# Patient Record
Sex: Male | Born: 1975 | Race: Black or African American | Hispanic: No | Marital: Single | Smoking: Never smoker
Health system: Southern US, Community
[De-identification: ages and names within clinical notes are randomized; demographics above are authoritative.]

## PROBLEM LIST (undated history)

## (undated) DIAGNOSIS — R51 Headache: Secondary | ICD-10-CM

## (undated) DIAGNOSIS — A539 Syphilis, unspecified: Secondary | ICD-10-CM

## (undated) DIAGNOSIS — B20 Human immunodeficiency virus [HIV] disease: Secondary | ICD-10-CM

## (undated) DIAGNOSIS — R739 Hyperglycemia, unspecified: Secondary | ICD-10-CM

## (undated) DIAGNOSIS — Z21 Asymptomatic human immunodeficiency virus [HIV] infection status: Secondary | ICD-10-CM

## (undated) HISTORY — DX: Human immunodeficiency virus (HIV) disease: B20

## (undated) HISTORY — DX: Syphilis, unspecified: A53.9

## (undated) HISTORY — DX: Headache: R51

## (undated) HISTORY — DX: Asymptomatic human immunodeficiency virus (hiv) infection status: Z21

## (undated) HISTORY — DX: Hyperglycemia, unspecified: R73.9

---

## 2001-04-13 ENCOUNTER — Emergency Department (HOSPITAL_COMMUNITY): Admission: EM | Admit: 2001-04-13 | Discharge: 2001-04-14 | Payer: Self-pay

## 2001-04-14 ENCOUNTER — Encounter: Payer: Self-pay | Admitting: Emergency Medicine

## 2004-09-02 ENCOUNTER — Inpatient Hospital Stay (HOSPITAL_COMMUNITY): Admission: EM | Admit: 2004-09-02 | Discharge: 2004-09-12 | Payer: Self-pay | Admitting: Internal Medicine

## 2004-09-05 ENCOUNTER — Encounter (INDEPENDENT_AMBULATORY_CARE_PROVIDER_SITE_OTHER): Payer: Self-pay | Admitting: *Deleted

## 2004-09-05 ENCOUNTER — Encounter: Payer: Self-pay | Admitting: Infectious Disease

## 2004-09-05 ENCOUNTER — Ambulatory Visit: Payer: Self-pay | Admitting: Infectious Diseases

## 2004-09-05 LAB — CONVERTED CEMR LAB
CD4 Count: 10 microliters
CD4 T Cell Abs: 10

## 2005-12-03 ENCOUNTER — Encounter (INDEPENDENT_AMBULATORY_CARE_PROVIDER_SITE_OTHER): Payer: Self-pay | Admitting: *Deleted

## 2005-12-03 ENCOUNTER — Ambulatory Visit: Payer: Self-pay | Admitting: Infectious Diseases

## 2005-12-03 LAB — CONVERTED CEMR LAB
CD4 Count: 10 microliters
HIV 1 RNA Quant: 505000 copies/mL

## 2005-12-22 ENCOUNTER — Ambulatory Visit: Payer: Self-pay | Admitting: Infectious Diseases

## 2006-03-07 ENCOUNTER — Encounter: Admission: RE | Admit: 2006-03-07 | Discharge: 2006-03-07 | Payer: Self-pay | Admitting: Infectious Diseases

## 2006-03-07 ENCOUNTER — Ambulatory Visit: Payer: Self-pay | Admitting: Infectious Diseases

## 2006-03-07 ENCOUNTER — Encounter (INDEPENDENT_AMBULATORY_CARE_PROVIDER_SITE_OTHER): Payer: Self-pay | Admitting: *Deleted

## 2006-03-07 LAB — CONVERTED CEMR LAB
CD4 Count: 70 microliters
HIV 1 RNA Quant: 399 copies/mL

## 2006-04-11 ENCOUNTER — Ambulatory Visit: Payer: Self-pay | Admitting: Infectious Diseases

## 2006-10-18 ENCOUNTER — Encounter (INDEPENDENT_AMBULATORY_CARE_PROVIDER_SITE_OTHER): Payer: Self-pay | Admitting: *Deleted

## 2006-10-18 ENCOUNTER — Ambulatory Visit: Payer: Self-pay | Admitting: Internal Medicine

## 2006-10-18 ENCOUNTER — Encounter (INDEPENDENT_AMBULATORY_CARE_PROVIDER_SITE_OTHER): Payer: Self-pay | Admitting: Infectious Diseases

## 2006-10-18 ENCOUNTER — Encounter: Admission: RE | Admit: 2006-10-18 | Discharge: 2006-10-18 | Payer: Self-pay | Admitting: Internal Medicine

## 2006-10-18 LAB — CONVERTED CEMR LAB
CD4 Count: 270 uL
HIV 1 RNA Quant: 314 {copies}/mL

## 2007-01-02 ENCOUNTER — Encounter (INDEPENDENT_AMBULATORY_CARE_PROVIDER_SITE_OTHER): Payer: Self-pay | Admitting: *Deleted

## 2007-01-02 LAB — CONVERTED CEMR LAB

## 2007-01-15 ENCOUNTER — Encounter (INDEPENDENT_AMBULATORY_CARE_PROVIDER_SITE_OTHER): Payer: Self-pay | Admitting: *Deleted

## 2007-01-31 DIAGNOSIS — B20 Human immunodeficiency virus [HIV] disease: Secondary | ICD-10-CM

## 2007-02-06 ENCOUNTER — Encounter: Admission: RE | Admit: 2007-02-06 | Discharge: 2007-02-06 | Payer: Self-pay | Admitting: Infectious Diseases

## 2007-02-06 ENCOUNTER — Ambulatory Visit: Payer: Self-pay | Admitting: Infectious Diseases

## 2007-04-13 DIAGNOSIS — Z8709 Personal history of other diseases of the respiratory system: Secondary | ICD-10-CM | POA: Insufficient documentation

## 2007-04-18 ENCOUNTER — Ambulatory Visit: Payer: Self-pay | Admitting: Infectious Diseases

## 2007-06-15 ENCOUNTER — Telehealth: Payer: Self-pay | Admitting: Internal Medicine

## 2007-08-07 ENCOUNTER — Encounter: Admission: RE | Admit: 2007-08-07 | Discharge: 2007-08-07 | Payer: Self-pay | Admitting: Infectious Disease

## 2007-08-07 ENCOUNTER — Encounter: Payer: Self-pay | Admitting: Infectious Diseases

## 2007-08-07 ENCOUNTER — Ambulatory Visit: Payer: Self-pay | Admitting: Infectious Disease

## 2007-08-07 ENCOUNTER — Encounter (INDEPENDENT_AMBULATORY_CARE_PROVIDER_SITE_OTHER): Payer: Self-pay | Admitting: *Deleted

## 2007-08-07 LAB — CONVERTED CEMR LAB
ALT: 15 units/L (ref 0–53)
AST: 19 units/L (ref 0–37)
Albumin: 4.5 g/dL (ref 3.5–5.2)
Alkaline Phosphatase: 143 units/L — ABNORMAL HIGH (ref 39–117)
BUN: 10 mg/dL (ref 6–23)
Basophils Absolute: 0 10*3/uL (ref 0.0–0.1)
Basophils Relative: 0 % (ref 0–1)
Bilirubin Urine: NEGATIVE
CO2: 22 meq/L (ref 19–32)
Calcium: 9.1 mg/dL (ref 8.4–10.5)
Chloride: 107 meq/L (ref 96–112)
Creatinine, Ser: 1.1 mg/dL (ref 0.40–1.50)
Eosinophils Absolute: 0.1 10*3/uL (ref 0.0–0.7)
Eosinophils Relative: 2 % (ref 0–5)
Glucose, Bld: 98 mg/dL (ref 70–99)
HCT: 42 % (ref 39.0–52.0)
HIV 1 RNA Quant: 262 copies/mL — ABNORMAL HIGH (ref <50)
HIV-1 RNA Quant, Log: 2.42 — ABNORMAL HIGH (ref <1.70)
Hemoglobin, Urine: NEGATIVE
Hemoglobin: 14.5 g/dL (ref 13.0–17.0)
Ketones, ur: NEGATIVE mg/dL
Leukocytes, UA: NEGATIVE
Lymphocytes Relative: 46 % (ref 12–46)
Lymphs Abs: 2.7 10*3/uL (ref 0.7–3.3)
MCHC: 34.5 g/dL (ref 30.0–36.0)
MCV: 88.6 fL (ref 78.0–100.0)
Monocytes Absolute: 0.4 10*3/uL (ref 0.2–0.7)
Monocytes Relative: 7 % (ref 3–11)
Neutro Abs: 2.7 10*3/uL (ref 1.7–7.7)
Neutrophils Relative %: 45 % (ref 43–77)
Nitrite: NEGATIVE
Platelets: 262 10*3/uL (ref 150–400)
Potassium: 3.9 meq/L (ref 3.5–5.3)
Protein, ur: NEGATIVE mg/dL
RBC: 4.74 M/uL (ref 4.22–5.81)
RDW: 12.9 % (ref 11.5–14.0)
Sodium: 141 meq/L (ref 135–145)
Specific Gravity, Urine: 1.023 (ref 1.005–1.03)
Total Bilirubin: 0.2 mg/dL — ABNORMAL LOW (ref 0.3–1.2)
Total Protein: 7.4 g/dL (ref 6.0–8.3)
Urine Glucose: NEGATIVE mg/dL
Urobilinogen, UA: 0.2 (ref 0.0–1.0)
WBC: 5.9 10*3/uL (ref 4.0–10.5)
pH: 6 (ref 5.0–8.0)

## 2007-08-21 ENCOUNTER — Ambulatory Visit: Payer: Self-pay | Admitting: Infectious Disease

## 2007-08-21 DIAGNOSIS — R809 Proteinuria, unspecified: Secondary | ICD-10-CM | POA: Insufficient documentation

## 2007-08-21 LAB — CONVERTED CEMR LAB
Creatinine, Urine: 124.4 mg/dL
Microalb Creat Ratio: 1.6 mg/g (ref 0.0–30.0)
Microalb, Ur: 0.2 mg/dL (ref 0.00–1.89)

## 2007-08-30 ENCOUNTER — Encounter (INDEPENDENT_AMBULATORY_CARE_PROVIDER_SITE_OTHER): Payer: Self-pay | Admitting: *Deleted

## 2007-10-23 ENCOUNTER — Encounter: Admission: RE | Admit: 2007-10-23 | Discharge: 2007-10-23 | Payer: Self-pay | Admitting: Infectious Disease

## 2007-10-23 ENCOUNTER — Ambulatory Visit: Payer: Self-pay | Admitting: Infectious Disease

## 2007-10-23 LAB — CONVERTED CEMR LAB
ALT: 9 units/L (ref 0–53)
AST: 16 units/L (ref 0–37)
Albumin: 4.7 g/dL (ref 3.5–5.2)
Alkaline Phosphatase: 137 units/L — ABNORMAL HIGH (ref 39–117)
BUN: 13 mg/dL (ref 6–23)
Basophils Absolute: 0 10*3/uL (ref 0.0–0.1)
Basophils Relative: 0 % (ref 0–1)
CO2: 25 meq/L (ref 19–32)
Calcium: 9.2 mg/dL (ref 8.4–10.5)
Chloride: 105 meq/L (ref 96–112)
Creatinine, Ser: 0.94 mg/dL (ref 0.40–1.50)
Eosinophils Absolute: 0.1 10*3/uL — ABNORMAL LOW (ref 0.2–0.7)
Eosinophils Relative: 2 % (ref 0–5)
Glucose, Bld: 87 mg/dL (ref 70–99)
HCT: 41.8 % (ref 39.0–52.0)
HIV 1 RNA Quant: 103 copies/mL — ABNORMAL HIGH (ref <50)
HIV-1 RNA Quant, Log: 2.01 — ABNORMAL HIGH (ref <1.70)
Hemoglobin: 14.4 g/dL (ref 13.0–17.0)
Lymphocytes Relative: 40 % (ref 12–46)
Lymphs Abs: 2 10*3/uL (ref 0.7–4.0)
MCHC: 34.4 g/dL (ref 30.0–36.0)
MCV: 88.9 fL (ref 78.0–100.0)
Monocytes Absolute: 0.3 10*3/uL (ref 0.1–1.0)
Monocytes Relative: 6 % (ref 3–12)
Neutro Abs: 2.6 10*3/uL (ref 1.7–7.7)
Neutrophils Relative %: 52 % (ref 43–77)
Platelets: 234 10*3/uL (ref 150–400)
Potassium: 4.5 meq/L (ref 3.5–5.3)
RBC: 4.7 M/uL (ref 4.22–5.81)
RDW: 12.7 % (ref 11.5–15.5)
Sodium: 141 meq/L (ref 135–145)
Total Bilirubin: 0.3 mg/dL (ref 0.3–1.2)
Total Protein: 7.7 g/dL (ref 6.0–8.3)
WBC: 5 10*3/uL (ref 4.0–10.5)

## 2007-11-07 ENCOUNTER — Encounter (INDEPENDENT_AMBULATORY_CARE_PROVIDER_SITE_OTHER): Payer: Self-pay | Admitting: *Deleted

## 2007-12-27 ENCOUNTER — Ambulatory Visit: Payer: Self-pay | Admitting: Infectious Disease

## 2007-12-27 ENCOUNTER — Encounter: Admission: RE | Admit: 2007-12-27 | Discharge: 2007-12-27 | Payer: Self-pay | Admitting: Infectious Disease

## 2007-12-27 LAB — CONVERTED CEMR LAB
ALT: 11 units/L (ref 0–53)
AST: 15 units/L (ref 0–37)
Albumin: 4.6 g/dL (ref 3.5–5.2)
Alkaline Phosphatase: 127 units/L — ABNORMAL HIGH (ref 39–117)
BUN: 13 mg/dL (ref 6–23)
Basophils Absolute: 0 10*3/uL (ref 0.0–0.1)
Basophils Relative: 1 % (ref 0–1)
CO2: 24 meq/L (ref 19–32)
Calcium: 9.1 mg/dL (ref 8.4–10.5)
Chloride: 107 meq/L (ref 96–112)
Cholesterol: 110 mg/dL (ref 0–200)
Creatinine, Ser: 0.92 mg/dL (ref 0.40–1.50)
Eosinophils Absolute: 0 10*3/uL (ref 0.0–0.7)
Eosinophils Relative: 1 % (ref 0–5)
Glucose, Bld: 88 mg/dL (ref 70–99)
HCT: 39.6 % (ref 39.0–52.0)
HDL: 43 mg/dL (ref >39)
HIV 1 RNA Quant: 77 copies/mL — ABNORMAL HIGH (ref <50)
HIV-1 RNA Quant, Log: 1.89 — ABNORMAL HIGH (ref <1.70)
Hemoglobin: 13.4 g/dL (ref 13.0–17.0)
LDL Cholesterol: 60 mg/dL (ref 0–99)
Lymphocytes Relative: 40 % (ref 12–46)
Lymphs Abs: 2.2 10*3/uL (ref 0.7–4.0)
MCHC: 33.8 g/dL (ref 30.0–36.0)
MCV: 90.6 fL (ref 78.0–100.0)
Monocytes Absolute: 0.3 10*3/uL (ref 0.1–1.0)
Monocytes Relative: 6 % (ref 3–12)
Neutro Abs: 2.9 10*3/uL (ref 1.7–7.7)
Neutrophils Relative %: 53 % (ref 43–77)
Platelets: 246 10*3/uL (ref 150–400)
Potassium: 4.3 meq/L (ref 3.5–5.3)
RBC: 4.37 M/uL (ref 4.22–5.81)
RDW: 13 % (ref 11.5–15.5)
Sodium: 140 meq/L (ref 135–145)
Total Bilirubin: 0.4 mg/dL (ref 0.3–1.2)
Total CHOL/HDL Ratio: 2.6
Total Protein: 7.5 g/dL (ref 6.0–8.3)
Triglycerides: 35 mg/dL (ref <150)
VLDL: 7 mg/dL (ref 0–40)
WBC: 5.5 10*3/uL (ref 4.0–10.5)

## 2008-04-30 ENCOUNTER — Encounter: Admission: RE | Admit: 2008-04-30 | Discharge: 2008-04-30 | Payer: Self-pay | Admitting: Infectious Disease

## 2008-04-30 ENCOUNTER — Ambulatory Visit: Payer: Self-pay | Admitting: Infectious Disease

## 2008-04-30 ENCOUNTER — Encounter: Payer: Self-pay | Admitting: Infectious Disease

## 2008-04-30 ENCOUNTER — Other Ambulatory Visit: Admission: RE | Admit: 2008-04-30 | Discharge: 2008-04-30 | Payer: Self-pay | Admitting: Infectious Disease

## 2008-04-30 DIAGNOSIS — L919 Hypertrophic disorder of the skin, unspecified: Secondary | ICD-10-CM

## 2008-04-30 DIAGNOSIS — R748 Abnormal levels of other serum enzymes: Secondary | ICD-10-CM | POA: Insufficient documentation

## 2008-04-30 DIAGNOSIS — L909 Atrophic disorder of skin, unspecified: Secondary | ICD-10-CM | POA: Insufficient documentation

## 2008-04-30 DIAGNOSIS — A630 Anogenital (venereal) warts: Secondary | ICD-10-CM

## 2008-04-30 LAB — CONVERTED CEMR LAB
ALT: 14 units/L (ref 0–53)
AST: 17 units/L (ref 0–37)
Albumin: 4.9 g/dL (ref 3.5–5.2)
Alkaline Phosphatase: 122 units/L — ABNORMAL HIGH (ref 39–117)
BUN: 14 mg/dL (ref 6–23)
Basophils Absolute: 0 10*3/uL (ref 0.0–0.1)
Basophils Relative: 0 % (ref 0–1)
Bilirubin Urine: NEGATIVE
CO2: 26 meq/L (ref 19–32)
Calcium: 9.6 mg/dL (ref 8.4–10.5)
Chlamydia, Swab/Urine, PCR: NEGATIVE
Chloride: 106 meq/L (ref 96–112)
Creatinine, Ser: 0.91 mg/dL (ref 0.40–1.50)
Eosinophils Absolute: 0 10*3/uL (ref 0.0–0.7)
Eosinophils Relative: 1 % (ref 0–5)
GC Probe Amp, Urine: NEGATIVE
Glucose, Bld: 89 mg/dL (ref 70–99)
HCT: 41.6 % (ref 39.0–52.0)
HIV 1 RNA Quant: <50 copies/mL (ref <50)
HIV-1 RNA Quant, Log: <1.7 (ref <1.70)
Hemoglobin, Urine: NEGATIVE
Hemoglobin: 13.7 g/dL (ref 13.0–17.0)
Ketones, ur: NEGATIVE mg/dL
Leukocytes, UA: NEGATIVE
Lymphocytes Relative: 32 % (ref 12–46)
Lymphs Abs: 1.6 10*3/uL (ref 0.7–4.0)
MCHC: 32.9 g/dL (ref 30.0–36.0)
MCV: 90.8 fL (ref 78.0–100.0)
Monocytes Absolute: 0.4 10*3/uL (ref 0.1–1.0)
Monocytes Relative: 7 % (ref 3–12)
Neutro Abs: 3.1 10*3/uL (ref 1.7–7.7)
Neutrophils Relative %: 61 % (ref 43–77)
Nitrite: NEGATIVE
Platelets: 256 10*3/uL (ref 150–400)
Potassium: 4.1 meq/L (ref 3.5–5.3)
Protein, ur: NEGATIVE mg/dL
RBC: 4.58 M/uL (ref 4.22–5.81)
RDW: 13.3 % (ref 11.5–15.5)
Sodium: 142 meq/L (ref 135–145)
Specific Gravity, Urine: 1.021 (ref 1.005–1.03)
Total Bilirubin: 0.3 mg/dL (ref 0.3–1.2)
Total Protein: 7.4 g/dL (ref 6.0–8.3)
Urine Glucose: NEGATIVE mg/dL
Urobilinogen, UA: 0.2 (ref 0.0–1.0)
WBC: 5.2 10*3/uL (ref 4.0–10.5)
pH: 6.5 (ref 5.0–8.0)

## 2008-05-28 ENCOUNTER — Encounter: Payer: Self-pay | Admitting: Infectious Disease

## 2008-06-17 ENCOUNTER — Ambulatory Visit (HOSPITAL_BASED_OUTPATIENT_CLINIC_OR_DEPARTMENT_OTHER): Admission: RE | Admit: 2008-06-17 | Discharge: 2008-06-17 | Payer: Self-pay | Admitting: General Surgery

## 2008-06-17 ENCOUNTER — Encounter (INDEPENDENT_AMBULATORY_CARE_PROVIDER_SITE_OTHER): Payer: Self-pay | Admitting: General Surgery

## 2008-06-21 ENCOUNTER — Encounter: Payer: Self-pay | Admitting: Infectious Disease

## 2008-10-14 ENCOUNTER — Encounter (INDEPENDENT_AMBULATORY_CARE_PROVIDER_SITE_OTHER): Payer: Self-pay | Admitting: *Deleted

## 2009-02-18 ENCOUNTER — Telehealth: Payer: Self-pay | Admitting: Internal Medicine

## 2009-03-11 ENCOUNTER — Ambulatory Visit: Payer: Self-pay | Admitting: Infectious Disease

## 2009-03-11 LAB — CONVERTED CEMR LAB
ALT: 19 units/L (ref 0–53)
AST: 18 units/L (ref 0–37)
Albumin: 4.3 g/dL (ref 3.5–5.2)
Alkaline Phosphatase: 111 units/L (ref 39–117)
BUN: 11 mg/dL (ref 6–23)
Basophils Absolute: 0 10*3/uL (ref 0.0–0.1)
Basophils Relative: 0 % (ref 0–1)
CO2: 25 meq/L (ref 19–32)
Calcium: 8.8 mg/dL (ref 8.4–10.5)
Chloride: 108 meq/L (ref 96–112)
Cholesterol: 105 mg/dL (ref 0–200)
Creatinine, Ser: 1.04 mg/dL (ref 0.40–1.50)
Eosinophils Absolute: 0 10*3/uL (ref 0.0–0.7)
Eosinophils Relative: 0 % (ref 0–5)
GFR calc Af Amer: >60 mL/min (ref >60)
GFR calc non Af Amer: >60 mL/min (ref >60)
Glucose, Bld: 89 mg/dL (ref 70–99)
HCT: 38.3 % — ABNORMAL LOW (ref 39.0–52.0)
HDL: 42 mg/dL (ref >39)
HIV 1 RNA Quant: 199 copies/mL — ABNORMAL HIGH (ref <48)
HIV-1 RNA Quant, Log: 2.3 — ABNORMAL HIGH (ref <1.68)
Hemoglobin: 13.4 g/dL (ref 13.0–17.0)
LDL Cholesterol: 57 mg/dL (ref 0–99)
Lymphocytes Relative: 38 % (ref 12–46)
Lymphs Abs: 1.7 10*3/uL (ref 0.7–4.0)
MCHC: 35 g/dL (ref 30.0–36.0)
MCV: 84.5 fL (ref 78.0–100.0)
Monocytes Absolute: 0.4 10*3/uL (ref 0.1–1.0)
Monocytes Relative: 8 % (ref 3–12)
Neutro Abs: 2.5 10*3/uL (ref 1.7–7.7)
Neutrophils Relative %: 54 % (ref 43–77)
Platelets: 253 10*3/uL (ref 150–400)
Potassium: 4 meq/L (ref 3.5–5.3)
RBC: 4.53 M/uL (ref 4.22–5.81)
RDW: 13.2 % (ref 11.5–15.5)
Sodium: 142 meq/L (ref 135–145)
Total Bilirubin: 0.3 mg/dL (ref 0.3–1.2)
Total CHOL/HDL Ratio: 2.5
Total Protein: 6.9 g/dL (ref 6.0–8.3)
Triglycerides: 32 mg/dL (ref <150)
VLDL: 6 mg/dL (ref 0–40)
WBC: 4.6 10*3/uL (ref 4.0–10.5)

## 2009-03-24 ENCOUNTER — Ambulatory Visit: Payer: Self-pay | Admitting: Infectious Disease

## 2009-11-10 ENCOUNTER — Ambulatory Visit: Payer: Self-pay | Admitting: Infectious Disease

## 2009-11-10 LAB — CONVERTED CEMR LAB
ALT: 11 units/L (ref 0–53)
AST: 14 units/L (ref 0–37)
Albumin: 4.2 g/dL (ref 3.5–5.2)
Alkaline Phosphatase: 108 units/L (ref 39–117)
BUN: 13 mg/dL (ref 6–23)
Basophils Absolute: 0 10*3/uL (ref 0.0–0.1)
Basophils Relative: 1 % (ref 0–1)
CO2: 22 meq/L (ref 19–32)
Calcium: 9 mg/dL (ref 8.4–10.5)
Chloride: 108 meq/L (ref 96–112)
Creatinine, Ser: 0.9 mg/dL (ref 0.40–1.50)
Eosinophils Absolute: 0 10*3/uL (ref 0.0–0.7)
Eosinophils Relative: 1 % (ref 0–5)
Glucose, Bld: 97 mg/dL (ref 70–99)
HCT: 38.4 % — ABNORMAL LOW (ref 39.0–52.0)
HIV 1 RNA Quant: <48 copies/mL (ref <48)
HIV-1 RNA Quant, Log: <1.68 (ref <1.68)
Hemoglobin: 13 g/dL (ref 13.0–17.0)
Lymphocytes Relative: 30 % (ref 12–46)
Lymphs Abs: 1.3 10*3/uL (ref 0.7–4.0)
MCHC: 33.9 g/dL (ref 30.0–36.0)
MCV: 86.7 fL (ref >=78.0)
Monocytes Absolute: 0.3 10*3/uL (ref 0.1–1.0)
Monocytes Relative: 6 % (ref 3–12)
Neutro Abs: 2.8 10*3/uL (ref 1.7–7.7)
Neutrophils Relative %: 63 % (ref 43–77)
Platelets: 258 10*3/uL (ref 150–400)
Potassium: 4.2 meq/L (ref 3.5–5.3)
RBC: 4.43 M/uL (ref 4.22–5.81)
RDW: 12.8 % (ref 11.5–15.5)
Sodium: 143 meq/L (ref 135–145)
Total Bilirubin: 0.3 mg/dL (ref 0.3–1.2)
Total Protein: 7.2 g/dL (ref 6.0–8.3)
WBC: 4.4 10*3/uL (ref 4.0–10.5)

## 2009-12-01 ENCOUNTER — Ambulatory Visit: Payer: Self-pay | Admitting: Infectious Disease

## 2010-04-14 ENCOUNTER — Telehealth (INDEPENDENT_AMBULATORY_CARE_PROVIDER_SITE_OTHER): Payer: Self-pay | Admitting: *Deleted

## 2010-07-31 ENCOUNTER — Telehealth (INDEPENDENT_AMBULATORY_CARE_PROVIDER_SITE_OTHER): Payer: Self-pay | Admitting: *Deleted

## 2010-08-25 ENCOUNTER — Telehealth (INDEPENDENT_AMBULATORY_CARE_PROVIDER_SITE_OTHER): Payer: Self-pay | Admitting: *Deleted

## 2010-10-06 ENCOUNTER — Encounter (INDEPENDENT_AMBULATORY_CARE_PROVIDER_SITE_OTHER): Payer: Self-pay | Admitting: *Deleted

## 2010-10-19 ENCOUNTER — Encounter (INDEPENDENT_AMBULATORY_CARE_PROVIDER_SITE_OTHER): Payer: Self-pay | Admitting: *Deleted

## 2010-10-20 ENCOUNTER — Telehealth (INDEPENDENT_AMBULATORY_CARE_PROVIDER_SITE_OTHER): Payer: Self-pay | Admitting: *Deleted

## 2010-11-23 ENCOUNTER — Encounter: Payer: Self-pay | Admitting: Infectious Disease

## 2010-11-23 ENCOUNTER — Ambulatory Visit
Admission: RE | Admit: 2010-11-23 | Discharge: 2010-11-23 | Payer: Self-pay | Source: Home / Self Care | Attending: Infectious Disease | Admitting: Infectious Disease

## 2010-11-23 LAB — CONVERTED CEMR LAB
ALT: 39 units/L (ref 0–53)
AST: 77 units/L — ABNORMAL HIGH (ref 0–37)
Albumin: 4.5 g/dL (ref 3.5–5.2)
Alkaline Phosphatase: 102 units/L (ref 39–117)
BUN: 12 mg/dL (ref 6–23)
Basophils Absolute: 0 10*3/uL (ref 0.0–0.1)
Basophils Relative: 1 % (ref 0–1)
CO2: 29 meq/L (ref 19–32)
Calcium: 9.4 mg/dL (ref 8.4–10.5)
Chloride: 107 meq/L (ref 96–112)
Cholesterol: 118 mg/dL (ref 0–200)
Creatinine, Ser: 0.9 mg/dL (ref 0.40–1.50)
Eosinophils Absolute: 0.1 10*3/uL (ref 0.0–0.7)
Eosinophils Relative: 1 % (ref 0–5)
Glucose, Bld: 97 mg/dL (ref 70–99)
HCT: 40.9 % (ref 39.0–52.0)
HDL: 51 mg/dL (ref >39)
HIV 1 RNA Quant: <20 copies/mL (ref <20)
HIV-1 RNA Quant, Log: <1.3 (ref <1.30)
Hemoglobin: 13.9 g/dL (ref 13.0–17.0)
LDL Cholesterol: 60 mg/dL (ref 0–99)
Lymphocytes Relative: 35 % (ref 12–46)
Lymphs Abs: 1.8 10*3/uL (ref 0.7–4.0)
MCHC: 34 g/dL (ref 30.0–36.0)
MCV: 89.5 fL (ref 78.0–100.0)
Monocytes Absolute: 0.4 10*3/uL (ref 0.1–1.0)
Monocytes Relative: 8 % (ref 3–12)
Neutro Abs: 2.9 10*3/uL (ref 1.7–7.7)
Neutrophils Relative %: 55 % (ref 43–77)
Platelets: 261 10*3/uL (ref 150–400)
Potassium: 4.6 meq/L (ref 3.5–5.3)
RBC: 4.57 M/uL (ref 4.22–5.81)
RDW: 12.8 % (ref 11.5–15.5)
RPR Ser Ql: REACTIVE — AB
RPR Titer: 1:16 {titer} — AB
Sodium: 142 meq/L (ref 135–145)
T pallidum Antibodies (TP-PA): >8 — ABNORMAL HIGH (ref <0.90)
Total Bilirubin: 0.3 mg/dL (ref 0.3–1.2)
Total CHOL/HDL Ratio: 2.3
Total Protein: 7.3 g/dL (ref 6.0–8.3)
Triglycerides: 37 mg/dL (ref <150)
VLDL: 7 mg/dL (ref 0–40)
WBC: 5.2 10*3/uL (ref 4.0–10.5)

## 2010-11-25 LAB — T-HELPER CELL (CD4) - (RCID CLINIC ONLY)
CD4 % Helper T Cell: 21 % — ABNORMAL LOW (ref 33–55)
CD4 T Cell Abs: 430 uL (ref 400–2700)

## 2010-12-06 LAB — CONVERTED CEMR LAB
ALT: 10 units/L (ref 0–53)
AST: 16 units/L (ref 0–37)
Albumin: 4.6 g/dL (ref 3.5–5.2)
Alkaline Phosphatase: 165 units/L — ABNORMAL HIGH (ref 39–117)
BUN: 14 mg/dL (ref 6–23)
Basophils Absolute: 0 10*3/uL (ref 0.0–0.1)
Basophils Relative: 0 % (ref 0–1)
Bilirubin Urine: NEGATIVE
CD4 Count: 160 microliters
CO2: 24 meq/L (ref 19–32)
Calcium: 9.2 mg/dL (ref 8.4–10.5)
Chloride: 103 meq/L (ref 96–112)
Cholesterol: 123 mg/dL (ref 0–200)
Creatinine, Ser: 1.05 mg/dL (ref 0.40–1.50)
Eosinophils Absolute: 0 10*3/uL (ref 0.0–0.7)
Eosinophils Relative: 1 % (ref 0–5)
Glucose, Bld: 94 mg/dL (ref 70–99)
HCT: 41.5 % (ref 39.0–52.0)
HDL: 42 mg/dL (ref >39)
HIV 1 RNA Quant: 332 copies/mL — ABNORMAL HIGH (ref <50)
HIV-1 RNA Quant, Log: 2.52 — ABNORMAL HIGH (ref <1.70)
Hemoglobin: 14.4 g/dL (ref 13.0–17.0)
Ketones, ur: NEGATIVE mg/dL
LDL Cholesterol: 68 mg/dL (ref 0–99)
Leukocytes, UA: NEGATIVE
Lymphocytes Relative: 23 % (ref 12–46)
Lymphs Abs: 0.8 10*3/uL (ref 0.7–3.3)
MCHC: 34.7 g/dL (ref 30.0–36.0)
MCV: 88.7 fL (ref 78.0–100.0)
Monocytes Absolute: 0.3 10*3/uL (ref 0.2–0.7)
Monocytes Relative: 9 % (ref 3–11)
Neutro Abs: 2.3 10*3/uL (ref 1.7–7.7)
Neutrophils Relative %: 67 % (ref 43–77)
Nitrite: NEGATIVE
Platelets: 235 10*3/uL (ref 150–400)
Potassium: 4 meq/L (ref 3.5–5.3)
Protein, ur: 30 mg/dL — AB
RBC: 4.68 M/uL (ref 4.22–5.81)
RDW: 12.8 % (ref 11.5–14.0)
Sodium: 136 meq/L (ref 135–145)
Specific Gravity, Urine: 1.029 (ref 1.005–1.03)
Total Bilirubin: 0.3 mg/dL (ref 0.3–1.2)
Total CHOL/HDL Ratio: 2.9
Total Protein: 7.7 g/dL (ref 6.0–8.3)
Triglycerides: 63 mg/dL (ref <150)
Urine Glucose: NEGATIVE mg/dL
Urobilinogen, UA: 0.2 (ref 0.0–1.0)
VLDL: 13 mg/dL (ref 0–40)
WBC: 3.5 10*3/uL — ABNORMAL LOW (ref 4.0–10.5)
pH: 6.5 (ref 5.0–8.0)

## 2010-12-07 ENCOUNTER — Encounter: Payer: Self-pay | Admitting: Infectious Disease

## 2010-12-07 ENCOUNTER — Ambulatory Visit
Admission: RE | Admit: 2010-12-07 | Discharge: 2010-12-07 | Payer: Self-pay | Source: Home / Self Care | Attending: Infectious Disease | Admitting: Infectious Disease

## 2010-12-07 DIAGNOSIS — A529 Late syphilis, unspecified: Secondary | ICD-10-CM | POA: Insufficient documentation

## 2010-12-07 LAB — CONVERTED CEMR LAB
Chlamydia, Swab/Urine, PCR: NEGATIVE
GC Probe Amp, Urine: NEGATIVE

## 2010-12-08 NOTE — Assessment & Plan Note (Signed)
Summary: fukam   Visit Type:  Follow-up Primary Provider:  Paulette Blanch Dam MD  CC:  F/U LABS.  History of Present Illness: 35 year old African American male with HIV VL undetectable and CD4 at 270, presents to clinic for followup. He is sexually active with his HIV positive partner (also on [place]) and claims that they use condoms with all forms of sexual intercourse. He has no other complaints today.  Preventive Screening-Counseling & Management  Alcohol-Tobacco     Alcohol drinks/day: 0     Smoking Status: never     Passive Smoke Exposure: no  Caffeine-Diet-Exercise     Caffeine use/day: 1 cup of coffee/1 glass of tea per day, occassional soda     Does Patient Exercise: yes     Type of exercise: video workouts     Exercise (avg: min/session): 30-60     Times/week: 4      Drug Use:  no.     Current Allergies (reviewed today): ! 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 Never Smoked Alcohol use-no Drug use-no  Risk Factors: Alcohol Use: 0 (12/01/2009) Caffeine Use: 1 cup of coffee/1 glass of tea per day, occassional soda (12/01/2009) Exercise: yes (12/01/2009)  Risk Factors: Smoking Status: never (12/01/2009) Passive Smoke Exposure: no (12/01/2009)  Past Surgical History: none  Review of Systems  The patient denies anorexia, fever, weight loss, weight gain, vision loss, decreased hearing, hoarseness, chest pain, syncope, dyspnea on exertion, peripheral edema, prolonged cough, headaches, hemoptysis, abdominal pain, melena, hematochezia, severe indigestion/heartburn, hematuria, incontinence, muscle weakness, suspicious skin lesions, transient blindness, difficulty walking, depression, unusual weight change,  abnormal bleeding, and enlarged lymph nodes.    Vital Signs:  Patient profile:   35 year old male Height:      67 inches (170.18 cm) Weight:      155 pounds (70.45 kg) BMI:     24.36 Pulse rate:   77 / minute BP sitting:   110 / 68  (left arm)  Vitals Entered By: Starleen Arms CMA (December 01, 2009 10:42 AM) CC: F/U LABS Is Patient Diabetic? No Pain Assessment Patient in pain? no      Nutritional Status BMI of 19 -24 = normal Nutritional Status Detail NL  Does patient need assistance? Functional Status Self care Ambulation Normal   Physical Exam  General:  alert, well-developed, and well-nourished.   Head:  normocephalic and atraumatic.   Eyes:  vision grossly intact, pupils equal and pupils round.   Ears:  no external deformities.   Nose:  no external deformity.   Mouth:  pharynx pink and moist, no erythema, and no exudates.   Neck:  supple and full ROM.   Lungs:  normal respiratory effort, normal respiratory effort, normal breath sounds, no crackles, and no wheezes.   Heart:  normal rate, regular rhythm, no murmur, no gallop, and no rub.   Abdomen:  no distention.  soft, normal bowel sounds, and no distention.   Msk:  normal ROM.  no joint deformities.   Extremities:  No clubbing, cyanosis, edema, or deformity noted with normal full range of motion of all joints.   Neurologic:  alert & oriented X3.  strength normal in all extremities and gait normal.   Skin:  no rashes and no ecchymoses.   Psych:  Oriented X3, memory intact for recent and remote, normally interactive, and good eye contact.          Medication Adherence: 12/01/2009   Adherence to medications reviewed with patient. Counseling to provide adequate adherence provided   Prevention For Positives: 12/01/2009   Safe sex practices discussed with patient. Condoms offered.   Education Materials Provided: 12/01/2009 Safe sex practices discussed with patient. Condoms offered.                           Impression & Recommendations:  Problem # 1:  HIV DISEASE (ICD-042) Assessment Comment Only  Excellent control! Diagnostics Reviewed:  HIV: CDC-defined AIDS (06/21/2008)   CD4: 270 (11/11/2009)   WBC: 4.4 (11/10/2009)   Hgb: 13.0 (11/10/2009)   HCT: 38.4 (11/10/2009)   Platelets: 258 (11/10/2009) HIV-1 RNA: <48 copies/mL (11/10/2009)   HBSAg: NO (01/02/2007)  Orders: Est. 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  Orders: Est. Patient Level IV (56213)  Problem # 3:  PROTEINURIA (ICD-791.0)  recheck UA ant next visit  Orders: Est. Patient Level IV (08657)  Problem # 4:  ENCOUNTER FOR LONG-TERM USE OF OTHER MEDICATIONS (ICD-V58.69) check lipids next visit  Other Orders: Admin 1st Vaccine (84696) Flu Vaccine 43yrs + (29528) Pneumococcal Vaccine (41324) Admin of Any Addtl Vaccine (40102) Future Orders: T-CD4SP ([facility]) (CD4SP) ... 11/26/2010 T-HIV Viral Load [id number]) ... 11/26/2010 T-CBC w/Diff ([id number]) ... 11/26/2010 T-RPR (Syphilis) [id number]) ... 11/26/2010 T-Comprehensive Metabolic Panel [id number]) ... 11/26/2010 T-Lipid Profile [id number]) ... 11/26/2010  Patient Instructions: 1)  Please schedule a follow-up appointment in 1 year. 2)  Be sure to return for lab work one (2) week before your next appointment as scheduled.  Process Orders Check Orders Results:     [facility]: ABN not required for this insurance Tests Sent for requisitioning (December 02, 2009 12:45 AM):     11/26/2010: [facility] -- T-HIV Viral Load [phone] (signed)     11/26/2010: [facility] -- T-CBC w/Diff [[id number]] (signed)     11/26/2010: [facility] -- T-RPR (Syphilis) [id number] (signed)     11/26/2010: [facility] -- T-Comprehensive Metabolic Panel [[id number]] (signed)     11/26/2010: [facility] -- T-Lipid Profile [phone] (signed)   Prevention & Chronic Care Immunizations   Influenza vaccine: Fluvax 3+  (12/01/2009)    Tetanus booster: Not documented    Pneumococcal vaccine: Pneumovax  (12/01/2009)  Other Screening   Smoking status: never  (12/01/2009)  Flu Vaccine Consent Questions     Do you have a history of severe allergic reactions to this vaccine? no    Any prior history of allergic reactions to egg and/or gelatin? no    Do you have a sensitivity to the preservative Thimersol? no    Do you have a past history of Guillan-Barre Syndrome? no    Do you currently have an acute febrile illness? no    Have you ever had a severe reaction to latex? no    Vaccine information given and explained to patient? yes    Are you currently pregnant? no    Lot [id number]   Exp Date:02/05/2010   Manufacturer: [employer]    Site Given  Left Deltoid IM Starleen Arms CMA  December 01, 2009 11:24 AM   Immunizations Administered:  Pneumonia Vaccine:    Vaccine Type: Pneumovax    Site: left deltoid  Mfr: Merck    Dose: 0.5 ml    Route: IM    Given by: Starleen Arms CMA    Exp. Date: 02/26/2011    Lot #: [id number]    VIS given: 06/05/96 version given December 01, 2009.  .mchsflu

## 2010-12-08 NOTE — Progress Notes (Signed)
Summary: refill/mld  Phone Note Refill Request Message from:  Fax from Pharmacy on April 14, 2010 9:01 AM  Refills Requested: Medication #1:  ATRIPLA 600-200-300 MG TABS Take 1 tablet by mouth at bedtime.   Last Refilled: 03/12/2010 Received fax refill request from [facility].   Method Requested: Fax to Local Pharmacy Next Appointment Scheduled: Jan 2012 Initial call taken by: Paulo Fruit  BS,CPht II,MPH,  April 14, 2010 9:01 AM    Prescriptions: ATRIPLA 600-200-300 MG TABS (EFAVIRENZ-EMTRICITAB-TENOFOVIR) Take 1 tablet by mouth at bedtime  #30 x 12   Entered by:   Paulo Fruit  BS,CPht II,MPH   Authorized by:   Acey Lav MD   Signed by:   Paulo Fruit  BS,CPht II,MPH on 04/14/2010   Method used:   [facility] to        [facility] [phone]* (retail)       [address]       [place], [place]  [address]       Ph: [phone] or [phone]       Fax: [phone]   RxID:   [id number]  Paulo Fruit  BS,CPht II,MPH  April 14, 2010 9:01 AM

## 2010-12-08 NOTE — Miscellaneous (Signed)
Summary: Orders Update - labs  Clinical Lists Changes  Orders: Added new Test order of T-CBC w/Diff [id number]) - Signed Added new Test order of T-CD4SP [facility]) (CD4SP) - Signed Added new Test order of T-Comprehensive Metabolic Panel [phone]) - Signed Added new Test order of T-HIV Viral Load [phone]) - Signed

## 2010-12-08 NOTE — Progress Notes (Signed)
Summary: Medication sample  Phone Note Call from Patient   Caller: Patient Summary of Call: patient needed medicine. Per Dr. Daiva Eves, gave patient a one month sample.

## 2010-12-08 NOTE — Miscellaneous (Signed)
  Clinical Lists Changes  Appended Document: lab added-ID

## 2010-12-08 NOTE — Progress Notes (Signed)
Summary: Pt called for appt w/Samuel Lindsey  Phone Note Call from Patient   Caller: Patient Details for Reason: RW card renewal Summary of Call: patient lives 4 hours away and is not due to come for office visit til 11/2010.  He has just started a new job but insurance has not come into affect yet.  He is needing assistance with getting medication.  An appt has been scheduled with Byrd Hesselbach for Tuesday, 08/04/10 to discuss all of this and get patient help Initial call taken by: Paulo Fruit  BS,CPht II,MPH,  July 31, 2010 3:17 PM

## 2010-12-10 NOTE — Miscellaneous (Signed)
Summary: RW Financial Update  Clinical Lists Changes  Observations: Added new observation of YEARLYEXPEN: 2200  (10/19/2010 14:21) Added new observation of RWTITLE: B  (10/19/2010 14:21) Added new observation of PCTFPL: 261.73  (10/19/2010 14:21) Added new observation of HOUSEINCOME: 63875  (10/19/2010 14:21) Added new observation of FINASSESSDT: 10/16/2010  (10/19/2010 14:21)

## 2010-12-10 NOTE — Progress Notes (Signed)
Summary: Atripla Co-pay card mailed to pt.   Phone Note Outgoing Call   Call placed by: Jennet Maduro RN,  October 20, 2010 12:03 PM Call placed to: Patient Action Taken: Information Sent Summary of Call: RN will mail an Atripla Co-pay card to pt.  Message left for pt. Jennet Maduro RN  October 20, 2010 12:04 PM

## 2010-12-16 NOTE — Assessment & Plan Note (Signed)
Summary: 2WK F/U ON LABS/VS   Visit Type:  Follow-up Primary Provider:  Paulette Blanch Dam MD  CC:  f/u.  History of Present Illness: 35 year old African American male with HIV VL undetectable and healthy cd4 count. His RPR was positive 1:16.  He had no primary chanchre or evidence of 2ndary syphilis. He admits to having unprotected intercourse with another HIV positive man with both receptive and insertive anal intercourse without condoms on one occasion. I warned him about danger of spreading his virus and also of secondary infection with HIV. He had diffuse rash with hives with amoxicillin but despite this wished for IM PCN. I told him I would feel better giving him oral doxycyline first . He is going back to RN school.  Problems Prior to Update: 1)  Need Proph Vacc&inoculat Against Oth Comb Dz  (ICD-V06.8) 2)  Encounter For Long-term Use of Other Medications  (ICD-V58.69) 3)  Skin Tag  (ICD-701.9) 4)  Alkaline Phosphatase, Elevated  (ICD-790.5) 5)  Condyloma Acuminatum  (ICD-078.11) 6)  Preventive Health Care  (ICD-V70.0) 7)  Proteinuria  (ICD-791.0) 8)  Pneumonia, Hx of  (ICD-V12.60) 9)  HIV Disease  (ICD-042)  Medications Prior to Update: 1)  Atripla 600-200-300 Mg Tabs (Efavirenz-Emtricitab-Tenofovir) .... Take 1 Tablet By Mouth At Bedtime  Current Medications (verified): 1)  Atripla 600-200-300 Mg Tabs (Efavirenz-Emtricitab-Tenofovir) .... Take 1 Tablet By Mouth At Bedtime 2)  Doxycycline Hyclate 100 Mg Tabs (Doxycycline Hyclate) .... Take 1 Tablet By Mouth Two Times A Day For 28 Days  Allergies (verified): 1)  ! Amoxicillin     Current Allergies (reviewed today): ! 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 Never Smoked Alcohol use-no Drug use-no  Risk Factors: Alcohol Use: 0  (12/01/2009) Caffeine Use: 1 cup of coffee/1 glass of tea per day, occassional soda (12/01/2009) Exercise: yes (12/01/2009)  Risk Factors: Smoking Status: never (12/01/2009) Passive Smoke Exposure: no (12/01/2009)  Past Medical History: HIV disease Pneumonia, hx of pneumocystis carinii  Condyloma accuminata Skin tag Late Syphliis  Review of Systems       see HPI otherwise negative on 12 pt review  Vital Signs:  Patient profile:   35 year old male Height:      67 inches (170.18 cm) Weight:      169.75 pounds (77.16 kg) BMI:     26.68 Temp:     97.7 degrees F (36.50 degrees C) oral Pulse rate:   94 / minute BP sitting:   129 / 80  (left arm)  Vitals Entered By: Starleen Arms CMA (December 07, 2010 10:16 AM) CC: f/u Is Patient Diabetic? No Pain Assessment Patient in pain? no      Nutritional Status BMI of 25 - 29 = overweight  Does patient need assistance? Functional Status Self care Ambulation Normal        Medication Adherence: 12/07/2010   Adherence to medications reviewed with patient. Counseling to provide adequate adherence provided   Prevention For Positives: 12/07/2010   Safe sex practices discussed with patient. Condoms offered.   Education Materials Provided: 12/07/2010 Safe sex practices discussed with patient. Condoms offered.                          Physical Exam  General:  alert, well-developed, and well-nourished.   Head:  normocephalic and atraumatic.  Eyes:  vision grossly intact, pupils equal and pupils round.   Ears:  no external deformities.   Nose:  no external deformity.   Mouth:  pharynx pink and moist, no erythema, and no exudates.   Neck:  supple and full ROM.   Lungs:  normal respiratory effort, normal respiratory effort, normal breath sounds, no crackles, and no wheezes.   Heart:  normal rate, regular rhythm, no murmur, no gallop, and no rub.   Abdomen:  no distention.  soft, normal bowel sounds, and no distention.     Msk:  normal ROM.  no joint deformities.   Extremities:  No clubbing, cyanosis, edema, or deformity noted with normal full range of motion of all joints.   Neurologic:  alert & oriented X3.  strength normal in all extremities and gait normal.   Skin:  no rashes and no ecchymoses.   Psych:  Oriented X3, memory intact for recent and remote, normally interactive, and good eye contact.     Impression & Recommendations:  Problem # 1:  HIV DISEASE (ICD-042)  Excellent control but he needs to use protection!! His updated medication list for this problem includes:    Doxycycline Hyclate 100 Mg Tabs (Doxycycline hyclate) .Marland Kitchen... Take 1 tablet by mouth two times a day for 28 days  Orders: Est. 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 Patient Level IV (98119)  Problem # 3:  SEXUAL ACTIVITY, HIGH RISK (ICD-V69.2)  needs to use condoms!!  Orders: Est. Patient Level IV (14782)  Medications Added to Medication List This Visit: 1)  Doxycycline Hyclate 100 Mg Tabs (Doxycycline hyclate) .... Take 1 tablet by mouth two times a day for 28 days  Other Orders: T-GC Probe, urine [id number]) T-Chlamydia  Probe, urine [id number]) Future Orders: T-CD4SP ([facility]) (CD4SP) ... 03/07/2011 T-HIV Viral Load [id number]) ... 03/07/2011 T-RPR (Syphilis) [phone]) ... 03/07/2011 T-CBC w/Diff ([id number]) ... 03/07/2011 T-Comprehensive Metabolic Panel [phone]) ... 03/07/2011  Patient Instructions: 1)  rtc in 3 months time for recheck of labs    Immunization History:  Influenza Immunization History:    Influenza:  historical (07/13/2010) Prescriptions: DOXYCYCLINE HYCLATE 100 MG TABS (DOXYCYCLINE HYCLATE) Take 1 tablet by mouth two times a day for 28 days  #56 x 0   Entered and Authorized by:   Acey Lav MD   Signed by:   Paulette Blanch Dam MD on  12/07/2010   Method used:   [facility] to        [facility] [address] [phone]* (retail)       [address]       [place], [place]  [address]       Ph: [phone] or [phone]       Fax: [phone]   RxID:   [id number]

## 2011-01-24 LAB — T-HELPER CELL (CD4) - (RCID CLINIC ONLY)
CD4 % Helper T Cell: 24 % — ABNORMAL LOW (ref 33–55)
CD4 T Cell Abs: 270 uL — ABNORMAL LOW (ref 400–2700)

## 2011-02-16 LAB — T-HELPER CELL (CD4) - (RCID CLINIC ONLY)
CD4 % Helper T Cell: 20 % — ABNORMAL LOW (ref 33–55)
CD4 T Cell Abs: 340 uL — ABNORMAL LOW (ref 400–2700)

## 2011-03-08 ENCOUNTER — Other Ambulatory Visit: Payer: Self-pay

## 2011-03-09 ENCOUNTER — Other Ambulatory Visit: Payer: 59

## 2011-03-09 DIAGNOSIS — B20 Human immunodeficiency virus [HIV] disease: Secondary | ICD-10-CM

## 2011-03-09 DIAGNOSIS — Z113 Encounter for screening for infections with a predominantly sexual mode of transmission: Secondary | ICD-10-CM

## 2011-03-09 LAB — COMPREHENSIVE METABOLIC PANEL
ALT: 42 U/L (ref 0–53)
AST: 51 U/L — ABNORMAL HIGH (ref 0–37)
Albumin: 4.4 g/dL (ref 3.5–5.2)
Alkaline Phosphatase: 102 U/L (ref 39–117)
BUN: 8 mg/dL (ref 6–23)
CO2: 24 mEq/L (ref 19–32)
Calcium: 8.8 mg/dL (ref 8.4–10.5)
Chloride: 104 mEq/L (ref 96–112)
Creat: 0.91 mg/dL (ref 0.40–1.50)
Glucose, Bld: 85 mg/dL (ref 70–99)
Potassium: 4 mEq/L (ref 3.5–5.3)
Sodium: 139 mEq/L (ref 135–145)
Total Bilirubin: 0.5 mg/dL (ref 0.3–1.2)
Total Protein: 7.1 g/dL (ref 6.0–8.3)

## 2011-03-10 LAB — CBC WITH DIFFERENTIAL/PLATELET
Basophils Absolute: 0 10*3/uL (ref 0.0–0.1)
Basophils Relative: 0 % (ref 0–1)
Eosinophils Absolute: 0.1 10*3/uL (ref 0.0–0.7)
Eosinophils Relative: 1 % (ref 0–5)
HCT: 40.1 % (ref 39.0–52.0)
Hemoglobin: 13.7 g/dL (ref 13.0–17.0)
Lymphocytes Relative: 21 % (ref 12–46)
Lymphs Abs: 1.9 10*3/uL (ref 0.7–4.0)
MCH: 29.8 pg (ref 26.0–34.0)
MCHC: 34.2 g/dL (ref 30.0–36.0)
MCV: 87.4 fL (ref 78.0–100.0)
Monocytes Absolute: 0.8 10*3/uL (ref 0.1–1.0)
Monocytes Relative: 9 % (ref 3–12)
Neutro Abs: 6.1 10*3/uL (ref 1.7–7.7)
Neutrophils Relative %: 69 % (ref 43–77)
Platelets: 267 10*3/uL (ref 150–400)
RBC: 4.59 MIL/uL (ref 4.22–5.81)
RDW: 12.7 % (ref 11.5–15.5)
WBC: 8.8 10*3/uL (ref 4.0–10.5)

## 2011-03-10 LAB — T.PALLIDUM AB, TOTAL: T pallidum Antibodies (TP-PA): >8 S/CO — ABNORMAL HIGH (ref <0.90)

## 2011-03-10 LAB — RPR TITER: RPR Titer: 1:8 {titer}

## 2011-03-10 LAB — T-HELPER CELL (CD4) - (RCID CLINIC ONLY): CD4 T Cell Abs: 400 uL (ref 400–2700)

## 2011-03-10 LAB — RPR: RPR Ser Ql: REACTIVE — AB

## 2011-03-22 ENCOUNTER — Ambulatory Visit: Payer: Self-pay | Admitting: Infectious Disease

## 2011-03-23 ENCOUNTER — Ambulatory Visit: Payer: Self-pay | Admitting: Infectious Disease

## 2011-03-23 NOTE — Op Note (Signed)
Samuel Lindsey, Samuel Lindsey               ACCOUNT NO.:  [id number]   MEDICAL RECORD NO.:  [id number]          PATIENT TYPE:  AMB   LOCATION:  [facility]SC                          FACILITY:  [facility]   PHYSICIAN:  Juanetta Gosling, MDDATE OF BIRTH:  Dec 11, 1975   DATE OF PROCEDURE:  06/17/2008  DATE OF DISCHARGE:                               OPERATIVE REPORT   PREOPERATIVE DIAGNOSIS:  Multiple anal condyloma.   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   PROCEDURE:  Examination under anesthesia, anoscopy, excision of large  anal condyloma, excision of single rectal condyloma, and fulguration of  multiple condyloma.   SURGEON:  Juanetta Gosling, MD   ASSISTANT:  None.   ANESTHESIA:  General.   SPECIMENS:  Condyloma to Pathology.   DRAINS:  None.   COMPLICATIONS:  None.   ESTIMATED BLOOD LOSS:  Minimal.   INDICATIONS:  Samuel Lindsey is a 35 year old male, HIV positive, with history  of anal condyloma over the past 3-4 months.  His complaint is that he  feels them there, they are increasing in size.  They are not interfering  with bowel movements or anal hygiene.  He denies any bleeding or any  itching of this area either.  He has no history of these before and  denies any prior anal surgeries.  There was also a concern by his  primary care physician that there may be neoplasia.  I discussed with  [name] doing an exam under anesthesia, anoscopy, and combination of  excision and fulguration of the condyloma, with a biopsy of at least one  of these to send to Pathology.   DESCRIPTION OF PROCEDURE:  After informed consent was obtained, the  patient was taken to the operating room where he was placed under  general anesthesia without complications.  He was then placed in prone  jackknife position and padded appropriately.  His buttocks were then  taped apart and prepped.  An exam under anesthesia was then performed,  noting a large single posterior anal  condyloma with multiple other  condylomas surrounding the perianal region and one on his perineum.  The  single large condyloma was excised using a combination of knife  dissection as well as electrocautery.  This hole was then closed with a  3-0 Vicryl suture upon completion.  Multiple other perianal condyloma  were fulgurated and removed.  An anoscopy was performed that showed a  single posterior condyloma that was excised sharply.  The remainder of  his anoscopy showed some small skin tags as well as some  small internal hemorrhoids in all three positions but was otherwise  normal.  Dibucaine ointment was then applied.  Sterile dressing was then  placed.  He was extubated in the operating room and transferred to the  PACU in stable condition.      Juanetta Gosling, MD  Electronically Signed     MCW/MEDQ  D:  06/17/2008  T:  06/18/2008  Job:  [id number]

## 2011-03-24 ENCOUNTER — Encounter: Payer: Self-pay | Admitting: Infectious Disease

## 2011-03-24 ENCOUNTER — Ambulatory Visit (INDEPENDENT_AMBULATORY_CARE_PROVIDER_SITE_OTHER): Payer: 59 | Admitting: Infectious Disease

## 2011-03-24 VITALS — BP 125/74 | HR 80 | Temp 98.5°F | Ht 67.0 in | Wt 172.0 lb

## 2011-03-24 DIAGNOSIS — R809 Proteinuria, unspecified: Secondary | ICD-10-CM

## 2011-03-24 DIAGNOSIS — B20 Human immunodeficiency virus [HIV] disease: Secondary | ICD-10-CM

## 2011-03-24 DIAGNOSIS — A529 Late syphilis, unspecified: Secondary | ICD-10-CM

## 2011-03-24 DIAGNOSIS — Z79899 Other long term (current) drug therapy: Secondary | ICD-10-CM

## 2011-03-24 NOTE — Assessment & Plan Note (Signed)
Continue Atripla 

## 2011-03-24 NOTE — Assessment & Plan Note (Signed)
Recheck RPR in 3 months time. If it is still failing to fall we may need to get a lumbar puncture to exclude neurosyphilis

## 2011-03-24 NOTE — Assessment & Plan Note (Signed)
Had problems of this in the past we should recheck the urine in the future. His blood pressure accident but well controlled however this is resolved

## 2011-03-24 NOTE — Progress Notes (Signed)
  Subjective:    Patient ID: Samuel Lindsey, male    DOB: Aug 04, 1976, 35 y.o.   MRN: [id number]  HPI 35 year old African American male with HIV VL undetectable and healthy cd4 count. His RPR was positive 1:16 in January and we treated him with a 20 day course of doxycycline. His repeat RPR done this month as a titer of 1-8. He denies basically active since being diagnosed with syphilis. His viral load does remain undetectable and CD4 count is above 400. He came in today to have several lab values filled out for a work physical and laboratory evaluation. His cholesterol LDL and HDL were all desirable levels and I told them the information he also requested a nicotine test which I did and possible hemoglobin A1c and measurement of his waist circumference. The patient is otherwise doing quite well has no specific complaints today. We spent over 45 minutes with the patient including greater than 50% time in counseling the patient and coordinating his care and been pulling out his paperwork.  Review of Systems  Constitutional: Negative for fever, chills, diaphoresis, activity change, appetite change and fatigue.  HENT: Positive for facial swelling. Negative for neck pain, neck stiffness and ear discharge.   Eyes: Negative for photophobia and visual disturbance.  Respiratory: Negative for apnea, cough, choking, chest tightness and shortness of breath.   Cardiovascular: Negative for chest pain, palpitations and leg swelling.  Gastrointestinal: Negative for abdominal pain, constipation, blood in stool, abdominal distention and anal bleeding.  Genitourinary: Negative for dysuria and difficulty urinating.  Musculoskeletal: Negative for myalgias, back pain, joint swelling, arthralgias and gait problem.  Skin: Negative for color change, pallor and rash.  Neurological: Negative for dizziness and headaches.  Hematological: Negative for adenopathy. Does not bruise/bleed easily.  Psychiatric/Behavioral: Negative for  behavioral problems, confusion, dysphoric mood, decreased concentration and agitation.       Objective:   Physical Exam  Constitutional: He appears well-developed and well-nourished. No distress.  HENT:  Head: Normocephalic and atraumatic.  Mouth/Throat: No oropharyngeal exudate.  Eyes: Conjunctivae and EOM are normal. Pupils are equal, round, and reactive to light. Right eye exhibits no discharge. Left eye exhibits no discharge. No scleral icterus.  Neck: Normal range of motion. Neck supple. No JVD present. No tracheal deviation present. No thyromegaly present.  Cardiovascular: Normal rate and regular rhythm.  Exam reveals no gallop and no friction rub.   No murmur heard. Pulmonary/Chest: Breath sounds normal. No respiratory distress. He has no wheezes. He exhibits no tenderness.  Abdominal: He exhibits no distension. There is no tenderness. There is no rebound and no guarding.  Musculoskeletal: He exhibits no edema and no tenderness.  Neurological: He is alert. Coordination normal.  Skin: Skin is warm and dry. No rash noted. He is not diaphoretic. No erythema. No pallor.  Psychiatric: He has a normal mood and affect. His behavior is normal. Thought content normal.          Assessment & Plan:  HIV DISEASE Continue Atripla  UNSPECIFIED LATE SYPHILIS Recheck RPR in 3 months time. If it is still failing to fall we may need to get a lumbar puncture to exclude neurosyphilis  PROTEINURIA Had problems of this in the past we should recheck the urine in the future. His blood pressure accident but well controlled however this is resolved

## 2011-03-26 NOTE — H&P (Signed)
Samuel Lindsey, Samuel Lindsey               ACCOUNT NO.:  [id number]   MEDICAL RECORD NO.:  [id number]          PATIENT TYPE:  INP   LOCATION:  0356                         FACILITY:  [facility]   PHYSICIAN:  Hollice Espy, M.D.DATE OF BIRTH:  07-16-1976   DATE OF ADMISSION:  09/02/2004  DATE OF DISCHARGE:                                HISTORY & PHYSICAL   ATTENDING PHYSICIAN:  Deirdre Peer. Polite, M.D.   PRIMARY CARE PHYSICIAN:  Vikki Ports, M.D.   CHIEF COMPLAINT:  Shortness of breath.   HISTORY OF PRESENT ILLNESS:  The patient is a 35 year old African-American  male with essentially no past medical history other than a hospitalization  for bronchitis back in March.  He presents after several days of having  increased shortness of breath with dyspnea on exertion.  He has had a  nonproductive cough, occasional subjective fevers and malaise.  He denies  any recent long car trips and has had no inactivity prior to not feeling  well.  He finally came after he has continued to feel bad.  This  approximately now has been going on now for five days.  He saw Dr. Lanell Persons in the office and at that time he was noted that a white count was  checked, which was reportedly normal with a mild shift.  In addition, his O2  sat was checked and was found to be 85% on room air.  Again, patient has no  previous history of asthma or SOB.  The patient was sent over to [facility]  for an x-ray, and Dr. Theresia Lo contacted myself on behalf of [facility]  Hospitalists for admission and further evaluation.  The patient was made a  direct admission and transferred up to the floor, where vitals and labs were  checked.  He was noted to be satting initially low at 85% on room air, even  at the hospital.  A repeat CBC with differential was ordered here, where he  had a normal white count but noted 88% shift.  In addition, electrolytes  were ordered, and he was noted to have a slight low potassium of 2.6.  An  ABG was done, and he was noted to have a pH of 7.51, pCO2 36 with a pO2 of  49.  He was satting at 84% on room air at the time his blood gas was drawn.  Patient currently was receiving a breathing treatment by face mask. When I  talked to him, he said his breathing was a little bit better, although  overall, he felt quite fatigued.  He tells me that lately he has been  feeling more short of breath than usual.  Normally, he feels fine and has an  active, healthy lifestyle.  He says he has no other medical problems.  Otherwise, he denies any other complaints.  He has no headaches, visual  changes, dysphagia, chest pain, palpitations, abdominal pain, hematuria,  dysuria, constipation, diarrhea, or wheezing.   PAST MEDICAL HISTORY:  For a bronchitis episode, which required  hospitalization back in March.   MEDICATIONS:  He takes an occasional  multivitamin.   ALLERGIES:  He has no known drug allergies.   SOCIAL HISTORY:  He denies any tobacco, alcohol, or drug use.   FAMILY HISTORY:  Positive only for hypertension.   PHYSICAL EXAMINATION:  VITALS ON ADMISSION:  Temp 98.2, blood pressure  129/74, respirations 24.  O2 sats are reportedly 78% on room air, up to 98%  on 2 liters O2.  GENERAL:  He is alert and oriented x 3.  No apparent distress.  HEENT:  Normocephalic and atraumatic.  Mucous membranes are slightly dry.  NECK:  No carotid bruits.  LUNGS:  He has a poor inspiratory effort with bibasilar crackles, left  greater than right, left approximately midway down to one-third of the way  on the right.  HEART:  Regular rate and rhythm.  S1 and S2.  ABDOMEN:  Soft, nontender, nondistended.  Positive bowel sounds.  EXTREMITIES:  No clubbing, cyanosis or edema.   RADIOLOGY:  His chest x-ray is pending.   LABORATORY DATA:  White count is 6.9 with 88% shift, H&H is 11.3 and 33.6,  MCV 80, platelet count 374.  Sodium 132, potassium 2.6, chloride 95, bicarb  29, BUN 7, creatinine 0.9,  glucose 108, calcium 8.5.  His ABG shows a pH of  7.51, pCO2 36, pO2 49, bicarb 28 with an O2 sat of 84% on room air.  A  repeat blood gas has been ordered for confirmation.   ASSESSMENT/PLAN:  A 35 year old African-American male with no past medical  history other than a previous bronchitis episode who presents with hypoxia.  He is admitted for further evaluation.  Noting his positive shift on his  complete blood count (CBC) and his significant hypoxia, I suspect he has a  severe community-acquired pneumonia.  Will repeat arterial blood gases (ABG)  to confirm.  Put him on antibiotics, nebs and O2.  Will review his chest x-  ray done at [facility].  Patient remains hypoxic.  Will go ahead and check  a CT of his chest to rule out pulmonary embolus, although at this time no  reason to suspect this.  1.  Hypokalemia:  Will go ahead and replace.  This is likely secondary to      dehydration.     Send   SKK/MEDQ  D:  09/02/2004  T:  09/02/2004  Job:  [id number]   cc:   Vikki Ports, M.D.  [address]  [place]  [place] [address]  Fax: [phone]

## 2011-03-26 NOTE — Discharge Summary (Signed)
Samuel Lindsey, Samuel Lindsey               ACCOUNT NO.:  [id number]   MEDICAL RECORD NO.:  [id number]          PATIENT TYPE:  INP   LOCATION:  0380                         FACILITY:  [facility]   PHYSICIAN:  Hollice Espy, M.D.DATE OF BIRTH:  16-Apr-1976   DATE OF ADMISSION:  09/02/2004  DATE OF DISCHARGE:  09/12/2004                                 DISCHARGE SUMMARY   PRIMARY CARE PHYSICIAN:  Vikki Ports, M.D.   CONSULTANTS:  Dr. Orvan Falconer, infectious disease.   DISCHARGE DIAGNOSES:  1.  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  2.  Hypoxia secondary to pneumonia.  3.  Hepatitis B.  4.  Hypokalemia and dehydration, now resolved.   DISCHARGE MEDICATIONS:  1.  Septra DS one p.o. q.6h x15 days, then one p.o. daily.  2.  Diflucan 100 mg p.o. every Monday.  3.  Prednisone 40 mg p.o. daily for three more days and 20 mg p.o. daily for      five days following.  4.  The patient has also been given prescription for an albuterol inhaler      two puffs four times a day p.r.n.   HISTORY OF PRESENT ILLNESS:  The patient is a 35 year old African-American  male with no past medical history with bronchitis which he was hospitalized  back in March.  He was made as a direct admission on September 02, 2004 after  his PCP Dr. Theresia Lo spoke with me in regards to the patient having three or  four days of increasing shortness of breath with dyspnea on exertion as well  as a nonproductive cough.  The patient had seen Dr. Theresia Lo on day of  admission and was noted to have an O2 saturation of 85% on room air.  The  patient had no previous history of asthma, shortness of breath, or car  trips.  He was sent to [facility] for chest x-ray and evaluation.  An ABG  was done.  The patient was found to have a pH 7.51, PCO2 36, Po2 49.  He was  put on oxygen and upgraded to a face mask.  He did relatively well feeling  better and was admitted for further evaluation.   LABORATORY DATA:   He was noted to have a white count of 6.9 with an 88%  shift.  His electrolytes were unremarkable with exception of potassium of  2.6 attributed to mild dehydration.   HOSPITAL COURSE:  1.  In regard to patient's pneumonia and newly diagnosed of AIDS, the      patient had a repeat blood gas which indeed confirm that he was severely      hypoxic. Given his young age, lack of any other risk factors, an HIV      titer was sent following day.  This ended up returning back as positive      with confirmation by Western blot.  It was suspected then that his      pneumonia was more consistent with PCP.  Initially, he had been put on      community-acquired pneumonia treatment with Zithromax and  Rocephin.      This was changed over to Bactrim.  The patient was started on steroids.      He was also complaining of dysphagia and noted to have some thrush.  He      was started Mycelex troches as well as Diflucan.  Infectious disease Dr.      Orvan Falconer was consulted.  Hepatitis labs and a CD-4 count were ordered as      well.  Initially, Dr. Roxan Hockey of infectious disease met with the      patient on September 05, 2004.  In review of the patient's history he was      found to have no history of any drug use, IV or otherwise.  No alcohol      or smoking history, and the patient is indeed homosexual but is in a      monogamous relationship for the last five years.  The patient continued      to receive supplemental oxygen as well as steroids and Bactrim.  He had      been noted to have some elevated LFTs and hepatitis panel had been sent.      Hepatitis C antibody as well as surface antigen and Hep A antibodies      were negative.  However, we did have a positive core antigen antibody      for hepatitis B.  He seemed to be doing a little bit better through his      hospital course.  His dyspnea continued to decrease and he continued to      be able to exert himself more with less shortness of breath.  His       steroids continued to remain on a tapering dose slowly as well as his      Septra which initially was IV and was changed over to p.o. on September 10, 2004.  By September 10, 2004, the patient was doing well.  However, he      still hypoxic with saturation of 79% on room air and with ambulation      only as high as 83%.  Over the next course of several days, the patient      continued to receive oxygen, steroids, antibiotics and encouraged the      use of incentive spirometer.  By September 12, 2004, the patient was able      to ambulate well without oxygen and is saturating 96%.  He is felt to be      medically stable for discharge.  Plan will be to continue him on p.o.      Septra DS one p.o. q.6h for 15 more days for a total of 21 days of      therapy.  Then, he will continue to receive Septra DS as a prophylactic      dose once a day.  He will continue on Diflucan 100 mg once daily as      well.  He will continue 8 more days of a tapering steroid course.  He      has received education for HIV and AIDS while in the hospital and he      will follow up with Dr. Orvan Falconer in the infectious disease clinic.  Dr.      Orvan Falconer will call the patient on Monday, November 7 for scheduling an      appointment.  In addition, the patient will follow up with Dr. Theresia Lo  in the next 1-2 weeks.   The patient's disposition is improved from his initial admission.  His  activity will be as tolerated although he is advised to rest easy for the  next few weeks and not return to work for at least another week.  His diet  will be a regular diet.      Send   SKK/MEDQ  D:  09/12/2004  T:  09/13/2004  Job:  [id number]   cc:   Vikki Ports, M.D.  [address]  [place]  [place] [address]  Fax: [phone]   Cliffton Asters, M.D.  [address] [place]  [place] [address]  Fax: [phone]

## 2011-04-30 ENCOUNTER — Other Ambulatory Visit: Payer: Self-pay | Admitting: *Deleted

## 2011-04-30 DIAGNOSIS — B20 Human immunodeficiency virus [HIV] disease: Secondary | ICD-10-CM

## 2011-04-30 MED ORDER — EFAVIRENZ-EMTRICITAB-TENOFOVIR 600-200-300 MG PO TABS: 1.0000 | ORAL_TABLET | Freq: Every day | ORAL | Status: DC

## 2011-07-27 ENCOUNTER — Ambulatory Visit: Payer: 59 | Admitting: Infectious Disease

## 2011-07-27 ENCOUNTER — Other Ambulatory Visit: Payer: Self-pay | Admitting: Infectious Disease

## 2011-07-27 ENCOUNTER — Other Ambulatory Visit (INDEPENDENT_AMBULATORY_CARE_PROVIDER_SITE_OTHER): Payer: 59

## 2011-07-27 DIAGNOSIS — B20 Human immunodeficiency virus [HIV] disease: Secondary | ICD-10-CM

## 2011-07-28 LAB — COMPREHENSIVE METABOLIC PANEL
CO2: 29 mEq/L (ref 19–32)
Calcium: 9.4 mg/dL (ref 8.4–10.5)
Chloride: 105 mEq/L (ref 96–112)
Creat: 1.23 mg/dL (ref 0.50–1.35)
Glucose, Bld: 79 mg/dL (ref 70–99)
Sodium: 142 mEq/L (ref 135–145)
Total Bilirubin: 0.4 mg/dL (ref 0.3–1.2)
Total Protein: 7.1 g/dL (ref 6.0–8.3)

## 2011-07-28 LAB — CBC WITH DIFFERENTIAL/PLATELET
Eosinophils Absolute: 0.1 10*3/uL (ref 0.0–0.7)
Eosinophils Relative: 1 % (ref 0–5)
Hemoglobin: 14.3 g/dL (ref 13.0–17.0)
Lymphs Abs: 2.5 10*3/uL (ref 0.7–4.0)
MCH: 29.7 pg (ref 26.0–34.0)
MCV: 89.4 fL (ref 78.0–100.0)
Monocytes Relative: 10 % (ref 3–12)
RBC: 4.81 MIL/uL (ref 4.22–5.81)

## 2011-07-28 LAB — GC/CHLAMYDIA PROBE AMP, URINE
Chlamydia, Swab/Urine, PCR: NEGATIVE
GC Probe Amp, Urine: NEGATIVE

## 2011-07-28 LAB — RPR TITER: RPR Titer: 1:4 {titer}

## 2011-07-28 LAB — T.PALLIDUM AB, TOTAL: T pallidum Antibodies (TP-PA): >8 S/CO — ABNORMAL HIGH (ref <0.90)

## 2011-07-28 LAB — T-HELPER CELL (CD4) - (RCID CLINIC ONLY)
CD4 % Helper T Cell: 25 % — ABNORMAL LOW (ref 33–55)
CD4 T Cell Abs: 630 uL (ref 400–2700)

## 2011-07-29 LAB — HIV-1 RNA QUANT-NO REFLEX-BLD
HIV 1 RNA Quant: <20 copies/mL (ref <20)
HIV-1 RNA Quant, Log: <1.3 {Log} (ref <1.30)

## 2011-08-05 LAB — T-HELPER CELL (CD4) - (RCID CLINIC ONLY): CD4 T Cell Abs: 310 — ABNORMAL LOW

## 2011-08-06 LAB — BASIC METABOLIC PANEL
BUN: 14
CO2: 28
Chloride: 106
GFR calc non Af Amer: >60
Glucose, Bld: 122 — ABNORMAL HIGH
Potassium: 4.7
Sodium: 140

## 2011-08-06 LAB — DIFFERENTIAL
Basophils Absolute: 0
Basophils Relative: 0
Eosinophils Absolute: 0
Eosinophils Relative: 1
Lymphocytes Relative: 38

## 2011-08-06 LAB — CBC
HCT: 41.9
MCV: 91.5
Platelets: 275
RDW: 13.2

## 2011-08-10 ENCOUNTER — Telehealth: Payer: Self-pay | Admitting: *Deleted

## 2011-08-10 NOTE — Telephone Encounter (Signed)
States the $200 card for Atripla helps but he still has to pay over $400. Told him the new cards are good for $400. He will stop by & get one at his next appointment. He is not eligible for any med help as he has private insurance

## 2011-08-11 ENCOUNTER — Ambulatory Visit: Payer: 59 | Admitting: Infectious Disease

## 2011-08-13 LAB — T-HELPER CELL (CD4) - (RCID CLINIC ONLY): CD4 T Cell Abs: 270 — ABNORMAL LOW

## 2011-08-17 ENCOUNTER — Ambulatory Visit (INDEPENDENT_AMBULATORY_CARE_PROVIDER_SITE_OTHER): Payer: 59 | Admitting: Infectious Disease

## 2011-08-17 ENCOUNTER — Encounter: Payer: Self-pay | Admitting: Infectious Disease

## 2011-08-17 VITALS — BP 126/80 | HR 84 | Temp 97.8°F | Wt 176.5 lb

## 2011-08-17 DIAGNOSIS — Z23 Encounter for immunization: Secondary | ICD-10-CM

## 2011-08-17 DIAGNOSIS — B20 Human immunodeficiency virus [HIV] disease: Secondary | ICD-10-CM

## 2011-08-17 DIAGNOSIS — R809 Proteinuria, unspecified: Secondary | ICD-10-CM

## 2011-08-17 DIAGNOSIS — Z113 Encounter for screening for infections with a predominantly sexual mode of transmission: Secondary | ICD-10-CM

## 2011-08-17 DIAGNOSIS — A630 Anogenital (venereal) warts: Secondary | ICD-10-CM

## 2011-08-17 DIAGNOSIS — A530 Latent syphilis, unspecified as early or late: Secondary | ICD-10-CM

## 2011-08-17 DIAGNOSIS — A529 Late syphilis, unspecified: Secondary | ICD-10-CM

## 2011-08-17 DIAGNOSIS — B977 Papillomavirus as the cause of diseases classified elsewhere: Secondary | ICD-10-CM

## 2011-08-17 NOTE — Progress Notes (Signed)
  Subjective:    Patient ID: Samuel Lindsey, male    DOB: 10-09-1976, 35 y.o.   MRN: [id number]  HPI  35 year old [place] male superbly well controlled on [place]. He is doing very well today and is without complaints at all.  Review of Systems  Constitutional: Negative for fever, chills, diaphoresis, activity change, appetite change, fatigue and unexpected weight change.  HENT: Negative for congestion, sore throat, rhinorrhea, sneezing, trouble swallowing and sinus pressure.   Eyes: Negative for photophobia and visual disturbance.  Respiratory: Negative for cough, chest tightness, shortness of breath, wheezing and stridor.   Cardiovascular: Negative for chest pain, palpitations and leg swelling.  Gastrointestinal: Negative for nausea, vomiting, abdominal pain, diarrhea, constipation, blood in stool, abdominal distention and anal bleeding.  Genitourinary: Negative for dysuria, hematuria, flank pain and difficulty urinating.  Musculoskeletal: Negative for myalgias, back pain, joint swelling, arthralgias and gait problem.  Skin: Negative for color change, pallor, rash and wound.  Neurological: Negative for dizziness, tremors, weakness and light-headedness.  Hematological: Negative for adenopathy. Does not bruise/bleed easily.  Psychiatric/Behavioral: Negative for behavioral problems, confusion, sleep disturbance, dysphoric mood, decreased concentration and agitation.       Objective:   Physical Exam  Constitutional: He is oriented to person, place, and time. He appears well-developed and well-nourished. No distress.  HENT:  Head: Normocephalic and atraumatic.  Mouth/Throat: Oropharynx is clear and moist. No oropharyngeal exudate.  Eyes: Conjunctivae and EOM are normal. Pupils are equal, round, and reactive to light. No scleral icterus.  Neck: Normal range of motion. Neck supple. No JVD present.  Cardiovascular: Normal rate, regular rhythm and normal heart sounds.  Exam reveals no  gallop and no friction rub.   No murmur heard. Pulmonary/Chest: Effort normal and breath sounds normal. No respiratory distress. He has no wheezes. He has no rales. He exhibits no tenderness.  Abdominal: He exhibits no distension and no mass. There is no tenderness. There is no rebound and no guarding.  Musculoskeletal: He exhibits no edema and no tenderness.  Lymphadenopathy:    He has no cervical adenopathy.  Neurological: He is alert and oriented to person, place, and time. He has normal reflexes. He exhibits normal muscle tone. Coordination normal.  Skin: Skin is warm and dry. He is not diaphoretic. No erythema. No pallor.  Psychiatric: He has a normal mood and affect. His behavior is normal. Judgment and thought content normal.          Assessment & Plan:  HIV DISEASE Superb control.  UNSPECIFIED LATE SYPHILIS Check rpr at next visit  PROTEINURIA Recheck microablumin to creatinine ratio, consider 24 hr urine in future and /or ACEI

## 2011-08-17 NOTE — Assessment & Plan Note (Signed)
Check rpr at next visit

## 2011-08-17 NOTE — Assessment & Plan Note (Signed)
Recheck microablumin to creatinine ratio, consider 24 hr urine in future and /or ACEI

## 2011-08-17 NOTE — Assessment & Plan Note (Signed)
Superb control. 

## 2012-05-03 ENCOUNTER — Other Ambulatory Visit: Payer: Self-pay | Admitting: *Deleted

## 2012-05-03 DIAGNOSIS — B20 Human immunodeficiency virus [HIV] disease: Secondary | ICD-10-CM

## 2012-05-03 MED ORDER — EFAVIRENZ-EMTRICITAB-TENOFOVIR 600-200-300 MG PO TABS: 1.0000 | ORAL_TABLET | Freq: Every day | ORAL | Status: DC

## 2012-05-04 ENCOUNTER — Other Ambulatory Visit: Payer: Self-pay | Admitting: *Deleted

## 2012-05-04 DIAGNOSIS — B20 Human immunodeficiency virus [HIV] disease: Secondary | ICD-10-CM

## 2012-05-04 MED ORDER — EFAVIRENZ-EMTRICITAB-TENOFOVIR 600-200-300 MG PO TABS: 1.0000 | ORAL_TABLET | Freq: Every day | ORAL | Status: DC

## 2013-01-02 ENCOUNTER — Other Ambulatory Visit: Payer: 59

## 2013-01-18 ENCOUNTER — Ambulatory Visit: Payer: 59 | Admitting: Infectious Disease

## 2013-04-19 ENCOUNTER — Other Ambulatory Visit: Payer: Self-pay | Admitting: Infectious Disease

## 2013-04-19 ENCOUNTER — Other Ambulatory Visit: Payer: Self-pay | Admitting: Licensed Clinical Social Worker

## 2013-04-19 DIAGNOSIS — B20 Human immunodeficiency virus [HIV] disease: Secondary | ICD-10-CM

## 2013-04-19 MED ORDER — EFAVIRENZ-EMTRICITAB-TENOFOVIR 600-200-300 MG PO TABS: 1.0000 | ORAL_TABLET | Freq: Every day | ORAL | Status: DC

## 2013-05-09 ENCOUNTER — Other Ambulatory Visit: Payer: 59

## 2013-05-15 ENCOUNTER — Other Ambulatory Visit: Payer: Managed Care, Other (non HMO)

## 2013-05-15 DIAGNOSIS — B20 Human immunodeficiency virus [HIV] disease: Secondary | ICD-10-CM

## 2013-05-15 LAB — COMPLETE METABOLIC PANEL WITH GFR
CO2: 27 mEq/L (ref 19–32)
Calcium: 9.6 mg/dL (ref 8.4–10.5)
Chloride: 106 mEq/L (ref 96–112)
Creat: 1.11 mg/dL (ref 0.50–1.35)
GFR, Est African American: >89 mL/min
GFR, Est Non African American: 84 mL/min
Glucose, Bld: 87 mg/dL (ref 70–99)
Sodium: 142 mEq/L (ref 135–145)
Total Bilirubin: 0.5 mg/dL (ref 0.3–1.2)
Total Protein: 7.1 g/dL (ref 6.0–8.3)

## 2013-05-15 LAB — RPR TITER: RPR Titer: 1:2 {titer}

## 2013-05-15 LAB — CBC WITH DIFFERENTIAL/PLATELET
Eosinophils Absolute: 0.1 10*3/uL (ref 0.0–0.7)
Eosinophils Relative: 1 % (ref 0–5)
Hemoglobin: 14 g/dL (ref 13.0–17.0)
Lymphs Abs: 2.2 10*3/uL (ref 0.7–4.0)
MCH: 29.9 pg (ref 26.0–34.0)
MCV: 84.4 fL (ref 78.0–100.0)
Monocytes Relative: 6 % (ref 3–12)
RBC: 4.68 MIL/uL (ref 4.22–5.81)

## 2013-05-15 LAB — LIPID PANEL: HDL: 57 mg/dL (ref >39)

## 2013-05-15 LAB — RPR: RPR Ser Ql: REACTIVE — AB

## 2013-05-16 LAB — T-HELPER CELL (CD4) - (RCID CLINIC ONLY): CD4 % Helper T Cell: 25 % — ABNORMAL LOW (ref 33–55)

## 2013-05-16 LAB — T.PALLIDUM AB, TOTAL: T pallidum Antibodies (TP-PA): >8 S/CO — ABNORMAL HIGH (ref <0.90)

## 2013-05-23 ENCOUNTER — Encounter: Payer: Self-pay | Admitting: Infectious Disease

## 2013-05-23 ENCOUNTER — Ambulatory Visit (INDEPENDENT_AMBULATORY_CARE_PROVIDER_SITE_OTHER): Payer: Managed Care, Other (non HMO) | Admitting: Infectious Disease

## 2013-05-23 VITALS — BP 125/86 | HR 73 | Temp 98.3°F | Wt 172.0 lb

## 2013-05-23 DIAGNOSIS — B20 Human immunodeficiency virus [HIV] disease: Secondary | ICD-10-CM

## 2013-05-23 DIAGNOSIS — A530 Latent syphilis, unspecified as early or late: Secondary | ICD-10-CM

## 2013-05-23 NOTE — Progress Notes (Signed)
  Subjective:    Patient ID: Samuel Lindsey, male    DOB: Sep 02, 1976, 37 y.o.   MRN: [id number]  HPI   37 year old [place] male superbly well controlled on [place]. He is doing very well today and is without complaints at all. We reviewed all of his laboratory data including his syphilis titer which is gone down to 1-2.  Review of Systems  Constitutional: Negative for fever, chills, diaphoresis, activity change, appetite change, fatigue and unexpected weight change.  HENT: Negative for congestion, sore throat, rhinorrhea, sneezing, trouble swallowing and sinus pressure.   Eyes: Negative for photophobia and visual disturbance.  Respiratory: Negative for cough, chest tightness, shortness of breath, wheezing and stridor.   Cardiovascular: Negative for chest pain, palpitations and leg swelling.  Gastrointestinal: Negative for nausea, vomiting, abdominal pain, diarrhea, constipation, blood in stool, abdominal distention and anal bleeding.  Genitourinary: Negative for dysuria, hematuria, flank pain and difficulty urinating.  Musculoskeletal: Negative for myalgias, back pain, joint swelling, arthralgias and gait problem.  Skin: Negative for color change, pallor, rash and wound.  Neurological: Negative for dizziness, tremors, weakness and light-headedness.  Hematological: Negative for adenopathy. Does not bruise/bleed easily.  Psychiatric/Behavioral: Negative for behavioral problems, confusion, sleep disturbance, dysphoric mood, decreased concentration and agitation.       Objective:   Physical Exam  Constitutional: He is oriented to person, place, and time. He appears well-developed and well-nourished. No distress.  HENT:  Head: Normocephalic and atraumatic.  Mouth/Throat: Oropharynx is clear and moist. No oropharyngeal exudate.  Eyes: Conjunctivae and EOM are normal. Pupils are equal, round, and reactive to light. No scleral icterus.  Neck: Normal range of motion. Neck supple. No JVD  present.  Cardiovascular: Normal rate, regular rhythm and normal heart sounds.  Exam reveals no gallop and no friction rub.   No murmur heard. Pulmonary/Chest: Effort normal and breath sounds normal. No respiratory distress. He has no wheezes. He has no rales. He exhibits no tenderness.  Abdominal: He exhibits no distension and no mass. There is no tenderness. There is no rebound and no guarding.  Musculoskeletal: He exhibits no edema and no tenderness.  Lymphadenopathy:    He has no cervical adenopathy.  Neurological: He is alert and oriented to person, place, and time. He has normal reflexes. He exhibits normal muscle tone. Coordination normal.  Skin: Skin is warm and dry. He is not diaphoretic. No erythema. No pallor.  Psychiatric: He has a normal mood and affect. His behavior is normal. Judgment and thought content normal.          Assessment & Plan:  HIV: Continue Atripla recheck labs in 6 months.  Syphilis: Likely in his sero fast state at a titer of 1-2.

## 2013-10-08 ENCOUNTER — Other Ambulatory Visit: Payer: Self-pay | Admitting: Licensed Clinical Social Worker

## 2013-10-08 DIAGNOSIS — B20 Human immunodeficiency virus [HIV] disease: Secondary | ICD-10-CM

## 2013-10-08 MED ORDER — EFAVIRENZ-EMTRICITAB-TENOFOVIR 600-200-300 MG PO TABS: 1.0000 | ORAL_TABLET | Freq: Every day | ORAL | Status: DC

## 2014-01-28 ENCOUNTER — Other Ambulatory Visit: Payer: Self-pay | Admitting: *Deleted

## 2014-01-28 DIAGNOSIS — B20 Human immunodeficiency virus [HIV] disease: Secondary | ICD-10-CM

## 2014-01-28 MED ORDER — EFAVIRENZ-EMTRICITAB-TENOFOVIR 600-200-300 MG PO TABS: 1.0000 | ORAL_TABLET | Freq: Every day | ORAL | Status: DC

## 2014-01-28 NOTE — Telephone Encounter (Signed)
Pt needs to make MD/Lab appointment.

## 2014-01-29 ENCOUNTER — Other Ambulatory Visit: Payer: Self-pay | Admitting: Licensed Clinical Social Worker

## 2014-01-29 DIAGNOSIS — B20 Human immunodeficiency virus [HIV] disease: Secondary | ICD-10-CM

## 2014-01-29 MED ORDER — EFAVIRENZ-EMTRICITAB-TENOFOVIR 600-200-300 MG PO TABS: 1.0000 | ORAL_TABLET | Freq: Every day | ORAL | Status: DC

## 2014-03-13 ENCOUNTER — Other Ambulatory Visit (INDEPENDENT_AMBULATORY_CARE_PROVIDER_SITE_OTHER): Payer: Managed Care, Other (non HMO)

## 2014-03-13 ENCOUNTER — Other Ambulatory Visit: Payer: Self-pay | Admitting: Licensed Clinical Social Worker

## 2014-03-13 DIAGNOSIS — B20 Human immunodeficiency virus [HIV] disease: Secondary | ICD-10-CM

## 2014-03-13 DIAGNOSIS — Z79899 Other long term (current) drug therapy: Secondary | ICD-10-CM

## 2014-03-13 DIAGNOSIS — Z113 Encounter for screening for infections with a predominantly sexual mode of transmission: Secondary | ICD-10-CM

## 2014-03-13 LAB — CBC WITH DIFFERENTIAL/PLATELET
BASOS ABS: 0 10*3/uL (ref 0.0–0.1)
BASOS PCT: 0 % (ref 0–1)
EOS ABS: 0.1 10*3/uL (ref 0.0–0.7)
EOS PCT: 1 % (ref 0–5)
HEMATOCRIT: 39.8 % (ref 39.0–52.0)
Hemoglobin: 14.1 g/dL (ref 13.0–17.0)
Lymphocytes Relative: 42 % (ref 12–46)
Lymphs Abs: 2.9 10*3/uL (ref 0.7–4.0)
MCH: 29.8 pg (ref 26.0–34.0)
MCHC: 35.4 g/dL (ref 30.0–36.0)
MCV: 84.1 fL (ref 78.0–100.0)
MONO ABS: 0.7 10*3/uL (ref 0.1–1.0)
Monocytes Relative: 10 % (ref 3–12)
Neutro Abs: 3.2 10*3/uL (ref 1.7–7.7)
Neutrophils Relative %: 47 % (ref 43–77)
Platelets: 259 10*3/uL (ref 150–400)
RBC: 4.73 MIL/uL (ref 4.22–5.81)
RDW: 13.2 % (ref 11.5–15.5)
WBC: 6.8 10*3/uL (ref 4.0–10.5)

## 2014-03-13 MED ORDER — EFAVIRENZ-EMTRICITAB-TENOFOVIR 600-200-300 MG PO TABS: 1.0000 | ORAL_TABLET | Freq: Every day | ORAL | Status: DC

## 2014-03-14 LAB — COMPLETE METABOLIC PANEL WITH GFR
ALK PHOS: 99 U/L (ref 39–117)
ALT: 11 U/L (ref 0–53)
AST: 13 U/L (ref 0–37)
Albumin: 4.1 g/dL (ref 3.5–5.2)
BILIRUBIN TOTAL: 0.3 mg/dL (ref 0.2–1.2)
BUN: 9 mg/dL (ref 6–23)
CALCIUM: 9.1 mg/dL (ref 8.4–10.5)
CO2: 27 mEq/L (ref 19–32)
CREATININE: 0.89 mg/dL (ref 0.50–1.35)
Chloride: 106 mEq/L (ref 96–112)
GFR, Est African American: >89 mL/min
GFR, Est Non African American: >89 mL/min
Glucose, Bld: 78 mg/dL (ref 70–99)
Potassium: 4.1 mEq/L (ref 3.5–5.3)
Sodium: 141 mEq/L (ref 135–145)
Total Protein: 6.7 g/dL (ref 6.0–8.3)

## 2014-03-14 LAB — URINE CYTOLOGY ANCILLARY ONLY
Chlamydia: NEGATIVE
Neisseria Gonorrhea: NEGATIVE

## 2014-03-14 LAB — MICROALBUMIN / CREATININE URINE RATIO
Creatinine, Urine: 415.6 mg/dL
MICROALB UR: 0.5 mg/dL (ref 0.00–1.89)
Microalb Creat Ratio: 1.2 mg/g (ref 0.0–30.0)

## 2014-03-14 LAB — PHOSPHORUS: PHOSPHORUS: 3 mg/dL (ref 2.3–4.6)

## 2014-03-14 LAB — LIPID PANEL
CHOL/HDL RATIO: 2.1 ratio
CHOLESTEROL: 122 mg/dL (ref 0–200)
HDL: 57 mg/dL (ref >39)
LDL CALC: 57 mg/dL (ref 0–99)
TRIGLYCERIDES: 39 mg/dL (ref <150)
VLDL: 8 mg/dL (ref 0–40)

## 2014-03-14 LAB — T-HELPER CELL (CD4) - (RCID CLINIC ONLY)
CD4 % Helper T Cell: 25 % — ABNORMAL LOW (ref 33–55)
CD4 T Cell Abs: 710 /uL (ref 400–2700)

## 2014-03-14 LAB — HEPATITIS C ANTIBODY: HCV Ab: NEGATIVE

## 2014-03-14 LAB — T.PALLIDUM AB, TOTAL

## 2014-03-14 LAB — RPR: RPR: REACTIVE — AB

## 2014-03-14 LAB — RPR TITER

## 2014-03-15 LAB — HIV-1 RNA QUANT-NO REFLEX-BLD
HIV 1 RNA Quant: <20 copies/mL (ref <20)
HIV-1 RNA Quant, Log: <1.3 {Log} (ref <1.30)

## 2014-03-19 ENCOUNTER — Telehealth: Payer: Self-pay | Admitting: *Deleted

## 2014-03-19 DIAGNOSIS — A539 Syphilis, unspecified: Secondary | ICD-10-CM

## 2014-03-19 NOTE — Telephone Encounter (Signed)
Yes we will treat though his titer has not gone up by that much and has been here before. dont think we need lp

## 2014-03-19 NOTE — Telephone Encounter (Signed)
Denyse AmassCorey from [facility] called about this patient as he was listed as a contact for RPR. 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 Advised Denyse AmassCorey will forward Dr Daiva EvesVan Dam a note to see what his instructions are and call him back with the answer. He advised to call [phone].

## 2014-03-20 MED ORDER — DOXYCYCLINE HYCLATE 100 MG PO TABS: 100.0000 mg | ORAL_TABLET | Freq: Two times a day (BID) | ORAL | Status: DC

## 2014-03-20 NOTE — Addendum Note (Signed)
Addended by: Lurlean LeydenPOOLE, Ramiya Delahunty F on: 03/20/2014 03:01 PM   Modules accepted: Orders

## 2014-03-20 NOTE — Telephone Encounter (Signed)
When is the last time we gave him IM penicillin and when did he have the rash with amoxicillin before or after his last PCN shot?

## 2014-03-20 NOTE — Telephone Encounter (Addendum)
Called patient and advised him he needs to be treated for +RPR and he asked if he could get the Shots this time as last time he was given the oral treatment as he has a slight rash when he takes amoxicillian. He advised it is not a bad rash just small pimply rash and it does not itch. Advised him will have to ask Dr Daiva EvesVan Dam and call him back.

## 2014-03-20 NOTE — Telephone Encounter (Signed)
We have never given him IM penicillian and they rash was after oral as a child.

## 2014-03-20 NOTE — Telephone Encounter (Signed)
Has anyone EVER given him penicillin, amoxicillin, augmentin since then? If there is high concern he can have 28 days of doxy. 100mg  bid

## 2014-03-20 NOTE — Telephone Encounter (Signed)
Per Dr Daiva EvesVan Dam called the patient and advised him called in Rx for Doxy 100mg  BID for 28 days.

## 2014-04-03 ENCOUNTER — Encounter: Payer: Self-pay | Admitting: Infectious Disease

## 2014-04-03 ENCOUNTER — Ambulatory Visit (INDEPENDENT_AMBULATORY_CARE_PROVIDER_SITE_OTHER): Payer: Managed Care, Other (non HMO) | Admitting: Infectious Disease

## 2014-04-03 VITALS — BP 127/79 | HR 94 | Temp 98.1°F | Wt 177.0 lb

## 2014-04-03 DIAGNOSIS — B20 Human immunodeficiency virus [HIV] disease: Secondary | ICD-10-CM

## 2014-04-03 NOTE — Progress Notes (Signed)
  Subjective:    Patient ID: Samuel Lindsey, male    DOB: [birth year], 38 y.o.   MRN: [id number]  HPI   38 year old [place] male superbly well controlled on [place]. He is doing very well today and is without complaints at all. We reviewed all of his laboratory data including his syphilis titer  Had gone up to 1:8. He is on treatment with doxycycline 100mg  twice daily to complete 28 days.    Review of Systems  Constitutional: Negative for fever, chills, diaphoresis, activity change, appetite change, fatigue and unexpected weight change.  HENT: Negative for congestion, rhinorrhea, sinus pressure, sneezing, sore throat and trouble swallowing.   Eyes: Negative for photophobia and visual disturbance.  Respiratory: Negative for cough, chest tightness, shortness of breath, wheezing and stridor.   Cardiovascular: Negative for chest pain, palpitations and leg swelling.  Gastrointestinal: Negative for nausea, vomiting, abdominal pain, diarrhea, constipation, blood in stool, abdominal distention and anal bleeding.  Genitourinary: Negative for dysuria, hematuria, flank pain and difficulty urinating.  Musculoskeletal: Negative for arthralgias, back pain, gait problem, joint swelling and myalgias.  Skin: Negative for color change, pallor, rash and wound.  Neurological: Negative for dizziness, tremors, weakness and light-headedness.  Hematological: Negative for adenopathy. Does not bruise/bleed easily.  Psychiatric/Behavioral: Negative for behavioral problems, confusion, sleep disturbance, dysphoric mood, decreased concentration and agitation.       Objective:   Physical Exam  Constitutional: He is oriented to person, place, and time. He appears well-developed and well-nourished. No distress.  HENT:  Head: Normocephalic and atraumatic.  Mouth/Throat: Oropharynx is clear and moist. No oropharyngeal exudate.  Eyes: Conjunctivae and EOM are normal. Pupils are equal, round, and reactive to light.  No scleral icterus.  Neck: Normal range of motion. Neck supple. No JVD present.  Cardiovascular: Normal rate, regular rhythm and normal heart sounds.  Exam reveals no gallop and no friction rub.   No murmur heard. Pulmonary/Chest: Effort normal and breath sounds normal. No respiratory distress. He has no wheezes. He has no rales. He exhibits no tenderness.  Abdominal: He exhibits no distension and no mass. There is no tenderness. There is no rebound and no guarding.  Musculoskeletal: He exhibits no edema and no tenderness.  Lymphadenopathy:    He has no cervical adenopathy.  Neurological: He is alert and oriented to person, place, and time. He has normal reflexes. He exhibits normal muscle tone. Coordination normal.  Skin: Skin is warm and dry. He is not diaphoretic. No erythema. No pallor.  Psychiatric: He has a normal mood and affect. His behavior is normal. Judgment and thought content normal.          Assessment & Plan:  HIV: I reviewed his labs and also reviewed other STR. He is happy with [facility] so will continue that and recheck labs in 6 months. I spent greater than 25 minutes with the patient including greater than 50% of time in face to face counsel of the patient and in coordination of their care.   Syphilis:  Titer is back up to 1:8. Will recheck in 6 months if still up will consider LP and VDRL, CSF wbc, protein and protein to rule out Neurosyphilis. WIll also consider giving IM PCN though he did have bilateral rash on arms with amoxicillin that he clearly remembers

## 2015-01-27 ENCOUNTER — Telehealth: Payer: Self-pay | Admitting: *Deleted

## 2015-01-27 DIAGNOSIS — B20 Human immunodeficiency virus [HIV] disease: Secondary | ICD-10-CM

## 2015-01-27 MED ORDER — EFAVIRENZ-EMTRICITAB-TENOFOVIR 600-200-300 MG PO TABS: 1.0000 | ORAL_TABLET | Freq: Every day | ORAL | Status: DC

## 2015-01-27 NOTE — Telephone Encounter (Signed)
Pt's insurance changed, [facility] [address]  Needs to be seen, it's been over 6 months.

## 2015-02-24 ENCOUNTER — Other Ambulatory Visit: Payer: BLUE CROSS/BLUE SHIELD

## 2015-02-24 DIAGNOSIS — Z113 Encounter for screening for infections with a predominantly sexual mode of transmission: Secondary | ICD-10-CM

## 2015-02-24 DIAGNOSIS — B20 Human immunodeficiency virus [HIV] disease: Secondary | ICD-10-CM

## 2015-02-24 DIAGNOSIS — Z79899 Other long term (current) drug therapy: Secondary | ICD-10-CM

## 2015-02-24 LAB — LIPID PANEL
CHOL/HDL RATIO: 2.3 ratio
Cholesterol: 144 mg/dL (ref 0–200)
HDL: 63 mg/dL (ref >=40)
LDL Cholesterol: 71 mg/dL (ref 0–99)
Triglycerides: 48 mg/dL (ref <150)
VLDL: 10 mg/dL (ref 0–40)

## 2015-02-24 LAB — CBC WITH DIFFERENTIAL/PLATELET
Basophils Absolute: 0.1 10*3/uL (ref 0.0–0.1)
Basophils Relative: 1 % (ref 0–1)
EOS PCT: 3 % (ref 0–5)
Eosinophils Absolute: 0.2 10*3/uL (ref 0.0–0.7)
HCT: 42.6 % (ref 39.0–52.0)
HEMOGLOBIN: 14.5 g/dL (ref 13.0–17.0)
Lymphocytes Relative: 47 % — ABNORMAL HIGH (ref 12–46)
Lymphs Abs: 2.9 10*3/uL (ref 0.7–4.0)
MCH: 29.9 pg (ref 26.0–34.0)
MCHC: 34 g/dL (ref 30.0–36.0)
MCV: 87.8 fL (ref 78.0–100.0)
MONO ABS: 0.5 10*3/uL (ref 0.1–1.0)
MPV: 8.8 fL (ref 8.6–12.4)
Monocytes Relative: 9 % (ref 3–12)
NEUTROS ABS: 2.4 10*3/uL (ref 1.7–7.7)
NEUTROS PCT: 40 % — AB (ref 43–77)
Platelets: 242 10*3/uL (ref 150–400)
RBC: 4.85 MIL/uL (ref 4.22–5.81)
RDW: 13.8 % (ref 11.5–15.5)
WBC: 6.1 10*3/uL (ref 4.0–10.5)

## 2015-02-24 LAB — COMPLETE METABOLIC PANEL WITH GFR
ALK PHOS: 92 U/L (ref 39–117)
ALT: 18 U/L (ref 0–53)
AST: 16 U/L (ref 0–37)
Albumin: 4.2 g/dL (ref 3.5–5.2)
BUN: 14 mg/dL (ref 6–23)
CHLORIDE: 107 meq/L (ref 96–112)
CO2: 27 mEq/L (ref 19–32)
Calcium: 9.3 mg/dL (ref 8.4–10.5)
Creat: 0.93 mg/dL (ref 0.50–1.35)
GFR, Est African American: >89 mL/min
Glucose, Bld: 81 mg/dL (ref 70–99)
POTASSIUM: 4.3 meq/L (ref 3.5–5.3)
SODIUM: 142 meq/L (ref 135–145)
Total Bilirubin: 0.3 mg/dL (ref 0.2–1.2)
Total Protein: 7.1 g/dL (ref 6.0–8.3)

## 2015-02-24 NOTE — Addendum Note (Signed)
Addended by: Mariea ClontsGREEN, Carsynn Bethune D on: 02/24/2015 02:54 PM   Modules accepted: Orders

## 2015-02-25 LAB — T-HELPER CELL (CD4) - (RCID CLINIC ONLY)
CD4 % Helper T Cell: 27 % — ABNORMAL LOW (ref 33–55)
CD4 T Cell Abs: 780 /uL (ref 400–2700)

## 2015-02-25 LAB — RPR: RPR Ser Ql: REACTIVE — AB

## 2015-02-25 LAB — RPR TITER: RPR Titer: 1:2 {titer}

## 2015-02-25 LAB — FLUORESCENT TREPONEMAL AB(FTA)-IGG-BLD: FLUORESCENT TREPONEMAL ABS: REACTIVE — AB

## 2015-02-27 LAB — HIV-1 RNA QUANT-NO REFLEX-BLD
HIV 1 RNA QUANT: 43 {copies}/mL — AB (ref <20)
HIV-1 RNA Quant, Log: 1.63 {Log} — ABNORMAL HIGH (ref <1.30)

## 2015-03-01 LAB — HLA B*5701: HLA-B 5701 W/RFLX HLA-B HIGH: NEGATIVE

## 2015-03-13 ENCOUNTER — Ambulatory Visit: Payer: BLUE CROSS/BLUE SHIELD | Admitting: Infectious Disease

## 2015-07-22 ENCOUNTER — Other Ambulatory Visit: Payer: Self-pay | Admitting: Infectious Disease

## 2015-07-22 ENCOUNTER — Telehealth: Payer: Self-pay | Admitting: *Deleted

## 2015-07-22 DIAGNOSIS — B20 Human immunodeficiency virus [HIV] disease: Secondary | ICD-10-CM

## 2015-07-22 NOTE — Telephone Encounter (Signed)
Patient requesting refill of atripla. Last office visit 03/2014, but did have labs drawn 02/2015.  RN gave patient 1 month refill, with the notice that this will be his last until he sees a physician.  RN left message on patient's phone with this information as well. Andree Coss, RN

## 2015-10-16 ENCOUNTER — Telehealth: Payer: Self-pay | Admitting: Infectious Disease

## 2015-10-16 ENCOUNTER — Ambulatory Visit (INDEPENDENT_AMBULATORY_CARE_PROVIDER_SITE_OTHER): Payer: BLUE CROSS/BLUE SHIELD | Admitting: Infectious Disease

## 2015-10-16 ENCOUNTER — Encounter: Payer: Self-pay | Admitting: Infectious Disease

## 2015-10-16 VITALS — BP 138/82 | HR 86 | Temp 98.2°F | Wt 167.0 lb

## 2015-10-16 DIAGNOSIS — A539 Syphilis, unspecified: Secondary | ICD-10-CM | POA: Diagnosis not present

## 2015-10-16 DIAGNOSIS — B20 Human immunodeficiency virus [HIV] disease: Secondary | ICD-10-CM

## 2015-10-16 NOTE — Telephone Encounter (Signed)
error 

## 2015-10-16 NOTE — Progress Notes (Signed)
Chief complaint: followup for HIV care   Subjective:    Patient ID: Samuel Lindsey, male    DOB: 1976/01/30, 39 y.o.   MRN: [id number]  HPI   39 year old [place] male superbly well controlled on [place]. I have not seen him since 2015. Most recent labs in Spring of 2016 had suppressed viral load and healthy CD4. He has c/o confusion when he has to get up at 4am to work after having taken Atripla late at night.  Lab Results  Component Value Date   HIV1RNAQUANT 43* 02/24/2015   Lab Results  Component Value Date   CD4TABS 780 02/24/2015   CD4TABS 710 03/13/2014   CD4TABS 500 05/15/2013   Past Medical History  Diagnosis Date  . HIV infection (HCC)   . Syphilis     No past surgical history on file.  Family History  Problem Relation Age of Onset  . Stroke Father       Social History   Social History  . Marital Status: Single    Spouse Name: N/A  . Number of Children: N/A  . Years of Education: N/A   Social History Main Topics  . Smoking status: Never Smoker   . Smokeless tobacco: Never Used  . Alcohol Use: 0.0 oz/week    0 Glasses of wine per week  . Drug Use: No  . Sexual Activity:    Partners: Male   Other Topics Concern  . None   Social History Narrative    Allergies  Allergen Reactions  . Amoxicillin Rash     Current outpatient prescriptions:  .  ATRIPLA 600-200-300 MG per tablet, TAKE 1 TABLET BY MOUTH AT BEDTIME, Disp: 30 tablet, Rfl: 0   Review of Systems  Constitutional: Negative for fever, chills, diaphoresis, activity change, appetite change, fatigue and unexpected weight change.  HENT: Negative for congestion, rhinorrhea, sinus pressure, sneezing, sore throat and trouble swallowing.   Eyes: Negative for photophobia and visual disturbance.  Respiratory: Negative for cough, chest tightness, shortness of breath, wheezing and stridor.   Cardiovascular: Negative for chest pain, palpitations and leg swelling.  Gastrointestinal:  Negative for nausea, vomiting, abdominal pain, diarrhea, constipation, blood in stool, abdominal distention and anal bleeding.  Genitourinary: Negative for dysuria, hematuria, flank pain and difficulty urinating.  Musculoskeletal: Negative for myalgias, back pain, joint swelling, arthralgias and gait problem.  Skin: Negative for color change, pallor, rash and wound.  Neurological: Negative for dizziness, tremors, weakness and light-headedness.  Hematological: Negative for adenopathy. Does not bruise/bleed easily.  Psychiatric/Behavioral: Positive for confusion. Negative for behavioral problems, sleep disturbance, dysphoric mood, decreased concentration and agitation.       Objective:   Physical Exam  Constitutional: He is oriented to person, place, and time. He appears well-developed and well-nourished. No distress.  HENT:  Head: Normocephalic and atraumatic.  Mouth/Throat: Oropharynx is clear and moist. No oropharyngeal exudate.  Eyes: Conjunctivae and EOM are normal. Pupils are equal, round, and reactive to light. No scleral icterus.  Neck: Normal range of motion. Neck supple. No JVD present.  Cardiovascular: Normal rate, regular rhythm and normal heart sounds.  Exam reveals no gallop and no friction rub.   No murmur heard. Pulmonary/Chest: Effort normal and breath sounds normal. No respiratory distress. He has no wheezes. He has no rales. He exhibits no tenderness.  Abdominal: He exhibits no distension and no mass. There is no tenderness. There is no rebound and no guarding.  Musculoskeletal: He exhibits no edema or tenderness.  Lymphadenopathy:  He has no cervical adenopathy.  Neurological: He is alert and oriented to person, place, and time. He has normal reflexes. He exhibits normal muscle tone. Coordination normal.  Skin: Skin is warm and dry. He is not diaphoretic. No erythema. No pallor.  Psychiatric: He has a normal mood and affect. His behavior is normal. Judgment and thought  content normal.          Assessment & Plan:  HIV:He needs labs rechecked. We reviewed all of the other STR for which he is eligible including TRIUMEQ, ODEFSEY, GENVOYA. He is interested in them from greater bone and kidney safety profile. I think he would prefer either TRIUMEQ of ODEFSEY but he will review   Syphilis:  Recheck titers  I spent greater than 40 minutes with the patient including greater than 50% of time in face to face counsel of the patient re his HIV, potential new ARV regimens, syphilis and in coordination of his care.

## 2015-11-11 ENCOUNTER — Other Ambulatory Visit: Payer: Self-pay | Admitting: Infectious Disease

## 2015-11-11 DIAGNOSIS — B20 Human immunodeficiency virus [HIV] disease: Secondary | ICD-10-CM

## 2016-01-28 ENCOUNTER — Other Ambulatory Visit: Payer: Self-pay | Admitting: Infectious Disease

## 2016-12-06 ENCOUNTER — Other Ambulatory Visit: Payer: Self-pay

## 2016-12-06 DIAGNOSIS — B20 Human immunodeficiency virus [HIV] disease: Secondary | ICD-10-CM

## 2016-12-06 MED ORDER — EFAVIRENZ-EMTRICITAB-TENOFOVIR 600-200-300 MG PO TABS: 1.0000 | ORAL_TABLET | Freq: Every day | ORAL | 0 refills | Status: DC

## 2016-12-06 NOTE — Telephone Encounter (Signed)
Pt called stating that he's due for a Dr's appointment as well as a lab appointment and a refills for his Atripla. I scheduled his appointment for March 1,2018 as well as a lab appointment on 12/23/2016. 30 day [place] [facility] [place]'s [place].

## 2016-12-23 ENCOUNTER — Other Ambulatory Visit: Payer: Self-pay

## 2017-01-02 ENCOUNTER — Other Ambulatory Visit: Payer: Self-pay | Admitting: Infectious Disease

## 2017-01-02 DIAGNOSIS — B20 Human immunodeficiency virus [HIV] disease: Secondary | ICD-10-CM

## 2017-01-06 ENCOUNTER — Ambulatory Visit (INDEPENDENT_AMBULATORY_CARE_PROVIDER_SITE_OTHER): Payer: Managed Care, Other (non HMO) | Admitting: Infectious Disease

## 2017-01-06 ENCOUNTER — Encounter: Payer: Self-pay | Admitting: Infectious Disease

## 2017-01-06 VITALS — BP 117/78 | HR 85 | Temp 98.4°F | Wt 169.0 lb

## 2017-01-06 DIAGNOSIS — A529 Late syphilis, unspecified: Secondary | ICD-10-CM | POA: Diagnosis not present

## 2017-01-06 DIAGNOSIS — Z23 Encounter for immunization: Secondary | ICD-10-CM

## 2017-01-06 DIAGNOSIS — B20 Human immunodeficiency virus [HIV] disease: Secondary | ICD-10-CM | POA: Diagnosis not present

## 2017-01-06 DIAGNOSIS — R51 Headache: Secondary | ICD-10-CM | POA: Diagnosis not present

## 2017-01-06 DIAGNOSIS — R519 Headache, unspecified: Secondary | ICD-10-CM | POA: Insufficient documentation

## 2017-01-06 HISTORY — DX: Headache, unspecified: R51.9

## 2017-01-06 LAB — CBC WITH DIFFERENTIAL/PLATELET
BASOS PCT: 0 %
Basophils Absolute: 0 cells/uL (ref 0–200)
EOS PCT: 1 %
Eosinophils Absolute: 89 cells/uL (ref 15–500)
HCT: 36.8 % — ABNORMAL LOW (ref 38.5–50.0)
HEMOGLOBIN: 12.1 g/dL — AB (ref 13.2–17.1)
LYMPHS ABS: 3204 {cells}/uL (ref 850–3900)
Lymphocytes Relative: 36 %
MCH: 31.3 pg (ref 27.0–33.0)
MCHC: 32.9 g/dL (ref 32.0–36.0)
MCV: 95.1 fL (ref 80.0–100.0)
MPV: 8.3 fL (ref 7.5–12.5)
Monocytes Absolute: 890 cells/uL (ref 200–950)
Monocytes Relative: 10 %
NEUTROS ABS: 4717 {cells}/uL (ref 1500–7800)
Neutrophils Relative %: 53 %
Platelets: 221 10*3/uL (ref 140–400)
RBC: 3.87 MIL/uL — AB (ref 4.20–5.80)
RDW: 13.4 % (ref 11.0–15.0)
WBC: 8.9 10*3/uL (ref 3.8–10.8)

## 2017-01-06 MED ORDER — MENINGOCOCCAL A C Y&W-135 OLIG IM SOLR
0.5000 mL | Freq: Once | INTRAMUSCULAR | Status: AC
  Administered 2017-01-06: 0.5 mL via INTRAMUSCULAR

## 2017-01-06 NOTE — Progress Notes (Signed)
HPI: Samuel Lindsey is a 41 y.o. male who presents to the [facility] clinic today for HIV follow-up with Dr. Daiva EvesVan Dam.  He has not been seen since December 2016.   Allergies: Allergies  Allergen Reactions  . Amoxicillin Rash    Past Medical History: Past Medical History:  Diagnosis Date  . HIV infection (HCC)   . Syphilis     Social History: Social History   Social History  . Marital status: Single    Spouse name: N/A  . Number of children: N/A  . Years of education: N/A   Social History Main Topics  . Smoking status: Never Smoker  . Smokeless tobacco: Never Used  . Alcohol use 0.0 oz/week  . Drug use: No  . Sexual activity: Yes    Partners: Male   Other Topics Concern  . None   Social History Narrative  . None    Current Regimen: Atripla  Labs: HIV 1 RNA Quant (copies/mL)  Date Value  02/24/2015 43 (H)  03/13/2014 <20  05/15/2013 <20   CD4 T Cell Abs  Date Value  02/24/2015 780 /uL  03/13/2014 710 /uL  05/15/2013 500 cmm   Hep B S Ab (no units)  Date Value  01/02/2007 INDETER   Hepatitis B Surface Ag (no units)  Date Value  01/02/2007 NO   HCV Ab (no units)  Date Value  03/13/2014 NEGATIVE    CrCl: CrCl cannot be calculated (Patient's most recent lab result is older than the maximum 21 days allowed.).  Lipids:    Component Value Date/Time   CHOL 144 02/24/2015 1423   TRIG 48 02/24/2015 1423   HDL 63 02/24/2015 1423   CHOLHDL 2.3 02/24/2015 1423   VLDL 10 02/24/2015 1423   LDLCALC 71 02/24/2015 1423    Assessment: Samuel Lindsey has been on Atripla but has been off of it for a little while due to pharmacy issues.  We will get labs today including a genotype and he will come back and see us in 2 weeks to see what to restart him on.   Plans: - HIV labs and genotype today - F/u with pharmacy 3/15 at 11:30am  Samuel Lindsey L. Jc Veron, PharmD, CPP Infectious Diseases Clinical Pharmacist [facility] 01/06/2017, 3:51 PM

## 2017-01-06 NOTE — Patient Instructions (Signed)
Meningococcal vaccine today  Labs today  Meet with pharmacy in next 1-2 weeks  Then appt with Daiva EvesVan Dam in mid April

## 2017-01-06 NOTE — Progress Notes (Signed)
Chief complaint: followup for HIV care, Also complaining of sinus congestion and pain which is making him sensitive to light   Subjective:    Patient ID: Samuel Lindsey, male    DOB: [birth year], 41 y.o.   MRN: [id number]  HPI  41 year old [place] male previously superbly well controlled on [place]. I have not seen him since 2015, Then saw him again in 2016 and he was not seen again for the nearly 2 years.  He has insurance and had been receiving his antiretroviral medications but was denied refills of his Atripla when he continued to fail to make any appointments with me. When I asked him if he "stretched his medications he tells me that he was taking one half of an Atripla pill every day for 2 weeks during this time period but otherwise been taking his medications as prescribed.  I am concerned he may have failed with resistance.   Lab Results  Component Value Date   HIV1RNAQUANT 43 (H) 02/24/2015   HIV1RNAQUANT <20 03/13/2014   HIV1RNAQUANT <20 05/15/2013    Lab Results  Component Value Date   CD4TABS 780 02/24/2015   CD4TABS 710 03/13/2014   CD4TABS 500 05/15/2013   Past Medical History:  Diagnosis Date  . HIV infection (HCC)   . Syphilis     No past surgical history on file.  Family History  Problem Relation Age of Onset  . Stroke Father       Social History   Social History  . Marital status: Single    Spouse name: N/A  . Number of children: N/A  . Years of education: N/A   Social History Main Topics  . Smoking status: Never Smoker  . Smokeless tobacco: Never Used  . Alcohol use 0.0 oz/week  . Drug use: No  . Sexual activity: Yes    Partners: Male   Other Topics Concern  . None   Social History Narrative  . None    Allergies  Allergen Reactions  . Amoxicillin Rash     Current Outpatient Prescriptions:  .  efavirenz-emtricitabine-tenofovir (ATRIPLA) 600-200-300 MG tablet, Take 1 tablet by mouth at bedtime., Disp: 30 tablet, Rfl:  0   Review of Systems  Constitutional: Negative for activity change, appetite change, chills, diaphoresis, fatigue, fever and unexpected weight change.  HENT: Positive for congestion and sinus pressure. Negative for rhinorrhea, sneezing, sore throat and trouble swallowing.   Eyes: Positive for photophobia. Negative for visual disturbance.  Respiratory: Negative for cough, chest tightness, shortness of breath, wheezing and stridor.   Cardiovascular: Negative for chest pain, palpitations and leg swelling.  Gastrointestinal: Negative for abdominal distention, abdominal pain, anal bleeding, blood in stool, constipation, diarrhea, nausea and vomiting.  Genitourinary: Negative for difficulty urinating, dysuria, flank pain and hematuria.  Musculoskeletal: Negative for arthralgias, back pain, gait problem, joint swelling and myalgias.  Skin: Negative for color change, pallor, rash and wound.  Neurological: Negative for dizziness, tremors, weakness and light-headedness.  Hematological: Negative for adenopathy. Does not bruise/bleed easily.  Psychiatric/Behavioral: Negative for agitation, behavioral problems, decreased concentration, dysphoric mood and sleep disturbance.       Objective:   Physical Exam  Constitutional: He is oriented to person, place, and time. He appears well-developed and well-nourished. No distress.  HENT:  Head: Normocephalic and atraumatic.  Mouth/Throat: Oropharynx is clear and moist. No oropharyngeal exudate.  Eyes: Conjunctivae and EOM are normal. Pupils are equal, round, and reactive to light. No scleral icterus.  Neck: Normal range of  motion. Neck supple. No JVD present.  Cardiovascular: Normal rate, regular rhythm and normal heart sounds.  Exam reveals no gallop and no friction rub.   No murmur heard. Pulmonary/Chest: Effort normal and breath sounds normal. No respiratory distress. He has no wheezes. He has no rales. He exhibits no tenderness.  Abdominal: He exhibits  no distension and no mass. There is no tenderness. There is no rebound and no guarding.  Musculoskeletal: He exhibits no edema or tenderness.  Lymphadenopathy:    He has no cervical adenopathy.  Neurological: He is alert and oriented to person, place, and time. He has normal reflexes. He exhibits normal muscle tone. Coordination normal.  Skin: Skin is warm and dry. He is not diaphoretic. No erythema. No pallor.  Psychiatric: He has a normal mood and affect. His behavior is normal. Judgment and thought content normal.          Assessment & Plan:  HIV :Check viral load with reflexive genotype hopefully he has not failed with resistance. If he has will need to construct a salvage regimen. If he has remained suppressed I want to change him to a more modern regimen If available I would prefer BIKTARVY. If not then TRIUMEQ or Tivicay and Descovy  Syphilis:  Recheck titers, Check for other sexual transmitted infections  Sinus congestion 3 days advise use of anti-allergy medication and decongestant  I spent greater than 40 minutes with the patient including greater than 50% of time in face to face counsel of the patient re his HIV, potential new ARV regimens, Upmost importance of compliance with his regimenregarding his sinus congestion hissyphilis and in coordination of his care.

## 2017-01-07 LAB — COMPLETE METABOLIC PANEL WITH GFR
ALBUMIN: 3.8 g/dL (ref 3.6–5.1)
ALK PHOS: 85 U/L (ref 40–115)
ALT: 13 U/L (ref 9–46)
AST: 13 U/L (ref 10–40)
BUN: 12 mg/dL (ref 7–25)
CALCIUM: 9 mg/dL (ref 8.6–10.3)
CHLORIDE: 106 mmol/L (ref 98–110)
CO2: 28 mmol/L (ref 20–31)
Creat: 1.03 mg/dL (ref 0.60–1.35)
GFR, Est African American: >89 mL/min (ref >=60)
Glucose, Bld: 94 mg/dL (ref 65–99)
POTASSIUM: 3.6 mmol/L (ref 3.5–5.3)
SODIUM: 141 mmol/L (ref 135–146)
Total Bilirubin: 0.4 mg/dL (ref 0.2–1.2)
Total Protein: 6.5 g/dL (ref 6.1–8.1)

## 2017-01-07 LAB — LIPID PANEL
CHOL/HDL RATIO: 2.7 ratio (ref <5.0)
CHOLESTEROL: 113 mg/dL (ref <200)
HDL: 42 mg/dL (ref >40)
LDL Cholesterol: 55 mg/dL (ref <100)
TRIGLYCERIDES: 79 mg/dL (ref <150)
VLDL: 16 mg/dL (ref <30)

## 2017-01-07 LAB — T-HELPER CELL (CD4) - (RCID CLINIC ONLY)
CD4 % Helper T Cell: 25 % — ABNORMAL LOW (ref 33–55)
CD4 T CELL ABS: 860 /uL (ref 400–2700)

## 2017-01-07 LAB — RPR TITER

## 2017-01-07 LAB — RPR: RPR Ser Ql: REACTIVE — AB

## 2017-01-07 LAB — FLUORESCENT TREPONEMAL AB(FTA)-IGG-BLD: Fluorescent Treponemal ABS: REACTIVE — AB

## 2017-01-08 LAB — HIV-1 RNA,QN PCR W/REFLEX GENOTYPE
HIV-1 RNA, QN PCR: 119 Copies/mL — ABNORMAL HIGH
HIV-1 RNA, QN PCR: 2.08 Log cps/mL — ABNORMAL HIGH

## 2017-01-10 ENCOUNTER — Other Ambulatory Visit: Payer: Self-pay | Admitting: Infectious Disease

## 2017-01-10 DIAGNOSIS — B20 Human immunodeficiency virus [HIV] disease: Secondary | ICD-10-CM

## 2017-01-11 ENCOUNTER — Telehealth: Payer: Self-pay | Admitting: *Deleted

## 2017-01-11 NOTE — Telephone Encounter (Signed)
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 Had to leave a message for him to call the office.

## 2017-01-11 NOTE — Telephone Encounter (Signed)
Yes, I agree he should get penicillin IM.  His description is more like a mild reaction rather than a true rash and no hives.  thanks

## 2017-01-11 NOTE — Telephone Encounter (Signed)
Patient called and was advised of the need for treatment for +RPR and to see pharmacy in order to get medications refilled. He is coming to clinic 01/13/17 at 3 pm. He wants to have the injections instead of the oral antibiotics. He advised he had a rash with Amoxicillin, not sever, when he was a kid and he just wants it over. Advised will have to ask the doctor and will let him know.

## 2017-01-13 ENCOUNTER — Emergency Department (HOSPITAL_COMMUNITY)
Admission: EM | Admit: 2017-01-13 | Discharge: 2017-01-13 | Disposition: A | Discharge status: Home / Self Care | Payer: Managed Care, Other (non HMO) | Attending: Emergency Medicine | Admitting: Emergency Medicine

## 2017-01-13 ENCOUNTER — Ambulatory Visit (INDEPENDENT_AMBULATORY_CARE_PROVIDER_SITE_OTHER): Payer: Managed Care, Other (non HMO) | Admitting: Pharmacist

## 2017-01-13 ENCOUNTER — Emergency Department (HOSPITAL_COMMUNITY): Payer: Managed Care, Other (non HMO)

## 2017-01-13 ENCOUNTER — Ambulatory Visit: Payer: Managed Care, Other (non HMO)

## 2017-01-13 ENCOUNTER — Encounter (HOSPITAL_COMMUNITY): Payer: Self-pay

## 2017-01-13 DIAGNOSIS — H04301 Unspecified dacryocystitis of right lacrimal passage: Secondary | ICD-10-CM

## 2017-01-13 DIAGNOSIS — H05011 Cellulitis of right orbit: Secondary | ICD-10-CM | POA: Diagnosis not present

## 2017-01-13 DIAGNOSIS — B20 Human immunodeficiency virus [HIV] disease: Secondary | ICD-10-CM | POA: Diagnosis not present

## 2017-01-13 DIAGNOSIS — H578 Other specified disorders of eye and adnexa: Secondary | ICD-10-CM | POA: Diagnosis present

## 2017-01-13 DIAGNOSIS — L03213 Periorbital cellulitis: Secondary | ICD-10-CM

## 2017-01-13 LAB — BASIC METABOLIC PANEL
Anion gap: 8 (ref 5–15)
BUN: 17 mg/dL (ref 6–20)
CHLORIDE: 106 mmol/L (ref 101–111)
CO2: 26 mmol/L (ref 22–32)
Calcium: 9.1 mg/dL (ref 8.9–10.3)
Creatinine, Ser: 1.04 mg/dL (ref 0.61–1.24)
GFR calc non Af Amer: >60 mL/min (ref >60)
Glucose, Bld: 92 mg/dL (ref 65–99)
POTASSIUM: 4 mmol/L (ref 3.5–5.1)
SODIUM: 140 mmol/L (ref 135–145)

## 2017-01-13 LAB — CBC WITH DIFFERENTIAL/PLATELET
Basophils Absolute: 0 10*3/uL (ref 0.0–0.1)
Basophils Relative: 0 %
Eosinophils Absolute: 0.1 10*3/uL (ref 0.0–0.7)
Eosinophils Relative: 1 %
HCT: 35.6 % — ABNORMAL LOW (ref 39.0–52.0)
Hemoglobin: 11.9 g/dL — ABNORMAL LOW (ref 13.0–17.0)
LYMPHS ABS: 3 10*3/uL (ref 0.7–4.0)
LYMPHS PCT: 34 %
MCH: 31.4 pg (ref 26.0–34.0)
MCHC: 33.4 g/dL (ref 30.0–36.0)
MCV: 93.9 fL (ref 78.0–100.0)
Monocytes Absolute: 0.7 10*3/uL (ref 0.1–1.0)
Monocytes Relative: 8 %
NEUTROS PCT: 57 %
Neutro Abs: 5.1 10*3/uL (ref 1.7–7.7)
Platelets: 274 10*3/uL (ref 150–400)
RBC: 3.79 MIL/uL — AB (ref 4.22–5.81)
RDW: 12.5 % (ref 11.5–15.5)
WBC: 8.9 10*3/uL (ref 4.0–10.5)

## 2017-01-13 MED ORDER — IOPAMIDOL (ISOVUE-300) INJECTION 61%
INTRAVENOUS | Status: AC
  Administered 2017-01-13: 75 mL
  Filled 2017-01-13: qty 75

## 2017-01-13 MED ORDER — CLINDAMYCIN HCL 300 MG PO CAPS: 300.0000 mg | ORAL_CAPSULE | Freq: Four times a day (QID) | ORAL | 0 refills | Status: DC

## 2017-01-13 MED ORDER — AMBULATORY NON FORMULARY MEDICATION: 1.0000 | Freq: Every day | 2 refills | Status: DC

## 2017-01-13 MED ORDER — CLINDAMYCIN PHOSPHATE 600 MG/50ML IV SOLN
600.0000 mg | Freq: Once | INTRAVENOUS | Status: AC
  Administered 2017-01-13: 600 mg via INTRAVENOUS
  Filled 2017-01-13: qty 50

## 2017-01-13 MED FILL — BIKTARVY 50-200-25 MG TABS: 50-200-25 | 30 days supply | Qty: 30 | Fill #0

## 2017-01-13 NOTE — ED Triage Notes (Signed)
Per Pt, pt is coming from home with complaints of eye swelling and drainage that started two weeks. Pt reports following up with PCP and was told to continue with Zyrtec. Pt was given antibiotics after the Zyrtec wasn't working. Reports clogged eye duct that has increased in size.

## 2017-01-13 NOTE — Discharge Instructions (Signed)
Clindamycin as prescribed.  Dr. Sherryll BurgerShah will see you in the office tomorrow between 8 and 11:00.

## 2017-01-13 NOTE — ED Provider Notes (Signed)
[facility]-EMERGENCY DEPT Provider Note   CSN: [id number] Arrival date & time: 01/13/17  1456  By signing my name below, I, Marnette Burgess Long, attest that this documentation has been prepared under the direction and in the presence of Geoffery Lyons, MD . Electronically Signed: Marnette Burgess Long, Scribe. 01/13/2017. 4:32 PM.   History   Chief Complaint Chief Complaint  Patient presents with  . Eye Problem   The history is provided by the patient. No language interpreter was used.  Eye Problem   This is a new problem. The current episode started more than 1 week ago. The problem has been gradually worsening. There is a problem in the right eye. There was no injury mechanism. The pain is at a severity of 9/10. Associated symptoms include discharge. The treatment provided no relief.    HPI Comments:  Samuel Lindsey is a 41 y.o. male with a PMHx of HIV and Syphilis, who presents to the Emergency Department complaining of constant, gradually worsening, right eye swelling with clear drainage onset two weeks ago. He states his allergies are bad and thus he saw his PCP 9 days ago for this problem and was Rx'd with an Zyrtec that has not relieved his symptoms. Two days ago, he was Rx'd Ciprofloxacin and has not helped either. He states his eye duct is clogged and increasing in size. No acute injury noted. Moving his eye does not exacerbate his symptoms but direct palpation does. Pt denies fever, visual disturbance, and any other complaints at this time.   Past Medical History:  Diagnosis Date  . HIV infection (HCC)   . Sinus headache 01/06/2017  . Syphilis     Patient Active Problem List   Diagnosis Date Noted  . Sinus headache 01/06/2017  . Syphilis 10/16/2015  . Encounter for long-term (current) use of other medications 03/24/2011  . 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 SKIN TAG 04/30/2008  . ALKALINE PHOSPHATASE, ELEVATED 04/30/2008  . PROTEINURIA 08/21/2007  . PNEUMONIA,  HX OF 04/13/2007  . Human immunodeficiency virus (HIV) disease (HCC) 01/31/2007    History reviewed. No pertinent surgical history.   Home Medications    Prior to Admission medications   Medication Sig Start Date End Date Taking? Authorizing Provider  AMBULATORY NON FORMULARY MEDICATION Take 1 tablet by mouth daily. Medication Name: Susanne Borders 01/13/17   Randall Hiss, MD    Family History Family History  Problem Relation Age of Onset  . Stroke Father     Social History Social History  Substance Use Topics  . Smoking status: Never Smoker  . Smokeless tobacco: Never Used  . Alcohol use 0.0 oz/week     Allergies   Amoxicillin   Review of Systems Review of Systems  Constitutional: Negative for fever.  Eyes: Positive for pain and discharge. Negative for visual disturbance.  All other systems reviewed and are negative.    Physical Exam Updated Vital Signs BP 153/97 (BP Location: Left Arm)   Pulse 69   Temp 98.7 F (37.1 C) (Oral)   Resp 18   Ht 5\' 5"  (1.651 m)   Wt 169 lb (76.7 kg)   SpO2 100%   BMI 28.12 kg/m   Physical Exam  Constitutional: He is oriented to person, place, and time. He appears well-developed and well-nourished.  HENT:  Head: Normocephalic.  There is significant swelling of the periorbital soft tissues below the right eye. There is warmth and tenderness, but no definite fluctuance.   Eyes:  EOM are normal. Pupils are equal, round, and reactive to light. Right eye exhibits discharge. Right conjunctiva is injected.  There appears to be no discomfort with EOM. No pro-optosis.   Cardiovascular: Normal rate.   Pulmonary/Chest: Effort normal.  Abdominal: He exhibits no distension.  Musculoskeletal: Normal range of motion.  Neurological: He is alert and oriented to person, place, and time.  Skin: Skin is warm and dry.  Psychiatric: He has a normal mood and affect.  Nursing note and vitals reviewed.    ED Treatments / Results  DIAGNOSTIC  STUDIES:  Oxygen Saturation is 100% on RA, normal by my interpretation.    COORDINATION OF CARE:  4:28 PM Discussed treatment plan with pt at bedside including CT of the face with IV Abx and pt agreed to plan.  Labs (all labs ordered are listed, but only abnormal results are displayed) Labs Reviewed - No data to display  EKG  EKG Interpretation None       Radiology No results found.  Procedures Procedures (including critical care time)  Medications Ordered in ED Medications - No data to display   Initial Impression / Assessment and Plan / ED Course  I have reviewed the triage vital signs and the nursing notes.  Pertinent labs & imaging results that were available during my care of the patient were reviewed by me and considered in my medical decision making (see chart for details).  CT scan reveals right periorbital cellulitis and dacryocystitis with an inflamed lacrimal sac. These findings were discussed with Dr. Sherryll BurgerShah from ophthalmology who is recommending antibiotics. He will see the patient in the office tomorrow morning.  Final Clinical Impressions(s) / ED Diagnoses   Final diagnoses:  None    New Prescriptions New Prescriptions   No medications on file   I personally performed the services described in this documentation, which was scribed in my presence. The recorded information has been reviewed and is accurate.      Geoffery Lyonsouglas Cristin Penaflor, MD 01/13/17 [phone]

## 2017-01-13 NOTE — Telephone Encounter (Signed)
Thanks Rob!

## 2017-01-13 NOTE — Progress Notes (Signed)
HPI: Samuel Lindsey is a 41 y.o. male who presents to the [facility] pharmacy clinic to re-initiate HIV medications and get a PCN shot.  Allergies: Allergies  Allergen Reactions  . Amoxicillin Rash    Past Medical History: Past Medical History:  Diagnosis Date  . HIV infection (HCC)   . Sinus headache 01/06/2017  . Syphilis     Social History: Social History   Social History  . Marital status: Single    Spouse name: N/A  . Number of children: N/A  . Years of education: N/A   Social History Main Topics  . Smoking status: Never Smoker  . Smokeless tobacco: Never Used  . Alcohol use 0.0 oz/week  . Drug use: No  . Sexual activity: Yes    Partners: Male   Other Topics Concern  . Not on file   Social History Narrative  . No narrative on file    Current Regimen: Atripla - not since January  Labs: HIV 1 RNA Quant (copies/mL)  Date Value  02/24/2015 43 (H)  03/13/2014 <20  05/15/2013 <20   CD4 T Cell Abs (/uL)  Date Value  01/06/2017 860  02/24/2015 780  03/13/2014 710   Hep B S Ab (no units)  Date Value  01/02/2007 INDETER   Hepatitis B Surface Ag (no units)  Date Value  01/02/2007 NO   HCV Ab (no units)  Date Value  03/13/2014 NEGATIVE    CrCl: CrCl cannot be calculated (Unknown ideal weight.).  Lipids:    Component Value Date/Time   CHOL 113 01/06/2017 1635   TRIG 79 01/06/2017 1635   HDL 42 01/06/2017 1635   CHOLHDL 2.7 01/06/2017 1635   VLDL 16 01/06/2017 1635   LDLCALC 55 01/06/2017 1635    Assessment: Samuel Lindsey is here today to review his genotype and initiate HIV medications.  He has been virologically controlled on Atripla for years, but he stopped taking it in January because we wouldn't refill it without an appointment.  His viral load came back too low for a genotype to be done (HIV viral load 43).  I will initiate him on Biktarvy - the new STR. [facility]  He comes in today with a huge swollen eye.  He complained of increased  eye drainage when he was here last Tuesday, but Dr. Daiva EvesVan Dam and I thought it was a viral infection or allergies.  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  On Friday morning, his eye was completely swollen almost shut.  It is still extremely swollen today and oozing.  I had Marcelino DusterMichelle and [place], two RNs, take a look at his eye as well and we think he is having an allergic reaction to the eye drops.  We instructed him to go immediately to the ED and get checked out. He will go straight over there.  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 We were going to give it to him here but not while his eye is swollen.  Told him to see if they can give it to him in the ED or clear him to get it here. I will see him back in 3 weeks to assess adherence and side effect monitoring. I will also check back on him tomorrow to see how his eye is doing.  Of note, it could be syphilis in his eye as well.   Plans: -  Start Biktarvy 1 tablet PO daily - Sent to [facility] - Go straight to ED to get eye checked out - F/u with me again 3/27 at 2pm - Fu with Dr. Daiva Eves 4/19 at 3:15pm  Cassie L. Kuppelweiser, PharmD, CPP Infectious Diseases Clinical Pharmacist [facility] 01/13/2017, 2:36 PM

## 2017-01-13 NOTE — ED Notes (Signed)
ED Provider at bedside. 

## 2017-01-14 ENCOUNTER — Ambulatory Visit (INDEPENDENT_AMBULATORY_CARE_PROVIDER_SITE_OTHER): Payer: Managed Care, Other (non HMO) | Admitting: *Deleted

## 2017-01-14 DIAGNOSIS — A539 Syphilis, unspecified: Secondary | ICD-10-CM

## 2017-01-14 MED ORDER — PENICILLIN G BENZATHINE 1200000 UNIT/2ML IM SUSP
1.2000 10*6.[IU] | Freq: Once | INTRAMUSCULAR | Status: AC
  Administered 2017-01-14: 1.2 10*6.[IU] via INTRAMUSCULAR

## 2017-01-14 MED FILL — CLINDAMYCIN HCL 300 MG CAP: 300 | 7 days supply | Qty: 28 | Fill #0

## 2017-01-18 ENCOUNTER — Other Ambulatory Visit: Payer: Self-pay | Admitting: Pharmacist

## 2017-01-20 ENCOUNTER — Other Ambulatory Visit: Payer: Self-pay | Admitting: Pharmacist

## 2017-01-20 ENCOUNTER — Ambulatory Visit: Payer: Managed Care, Other (non HMO)

## 2017-01-20 DIAGNOSIS — B20 Human immunodeficiency virus [HIV] disease: Secondary | ICD-10-CM

## 2017-01-20 MED ORDER — BICTEGRAVIR-EMTRICITAB-TENOFOV 50-200-25 MG PO TABS: 1.0000 | ORAL_TABLET | Freq: Every day | ORAL | 2 refills | Status: DC

## 2017-02-01 ENCOUNTER — Ambulatory Visit: Payer: Managed Care, Other (non HMO)

## 2017-02-09 ENCOUNTER — Telehealth: Payer: Self-pay | Admitting: Pharmacist

## 2017-02-09 MED FILL — BIKTARVY 50-200-25 MG TABS: 50-200-25 | 30 days supply | Qty: 30 | Fill #0

## 2017-02-09 NOTE — Telephone Encounter (Signed)
Kathie Rhodes called [facility] for refill authorization and he asked to speak to a pharmacist about his diarrhea.  He said he has had diarrhea for 1-2 days.  Took imodium and it helped.  He is also complaining about his eye still.  He states it is constantly draining and healing but he is afraid it is going to scar.  I asked him if he followed up with the ophthalmologist and he said no he didn't go to the appointment.  Told him to call that office and talk to them.  He has an appt with Dr. Daiva Eves 4/19.

## 2017-02-09 NOTE — Telephone Encounter (Signed)
Thanks Cassie 

## 2017-02-24 ENCOUNTER — Ambulatory Visit: Payer: Managed Care, Other (non HMO) | Admitting: Infectious Disease

## 2017-03-01 ENCOUNTER — Ambulatory Visit: Payer: Managed Care, Other (non HMO) | Admitting: Infectious Disease

## 2017-03-09 MED FILL — BIKTARVY 50-200-25 MG TABS: 50-200-25 | 30 days supply | Qty: 30 | Fill #1

## 2017-04-11 MED FILL — BIKTARVY 50-200-25 MG TABS: 50-200-25 | 30 days supply | Qty: 30 | Fill #2

## 2017-05-06 ENCOUNTER — Other Ambulatory Visit: Payer: Self-pay | Admitting: Infectious Disease

## 2017-05-06 DIAGNOSIS — B20 Human immunodeficiency virus [HIV] disease: Secondary | ICD-10-CM

## 2017-05-06 MED FILL — BIKTARVY 50-200-25 MG TABS: 50-200-25 | 30 days supply | Qty: 30 | Fill #0

## 2017-06-01 ENCOUNTER — Other Ambulatory Visit: Payer: Self-pay | Admitting: Pharmacist

## 2017-06-13 MED FILL — BIKTARVY 50-200-25 MG TABS: 50-200-25 | 30 days supply | Qty: 30 | Fill #1

## 2017-07-28 IMAGING — CT CT MAXILLOFACIAL W/ CM
2 of 6 series · 5 of 47 positions shown, 6 images · IV contrast (iopamidol)
Comparison: None.

CLINICAL DATA: RIGHT eye pain and swelling for 2 weeks. History of
HIV.

EXAM:
CT MAXILLOFACIAL WITH CONTRAST
TECHNIQUE: Multidetector CT imaging of the maxillofacial structures was
performed. Multiplanar CT image reconstructions were also generated.
A small metallic BB was placed on the right temple in order to
reliably differentiate right from left.
CONTRAST:  75mL 3AE0MH-IKK IOPAMIDOL (3AE0MH-IKK) INJECTION 61%

[Series 2010: cor soft tissue · coronal · 0.30mm/px · 3 of 75 slices shown, 4 images]
[im 19/75  brain]
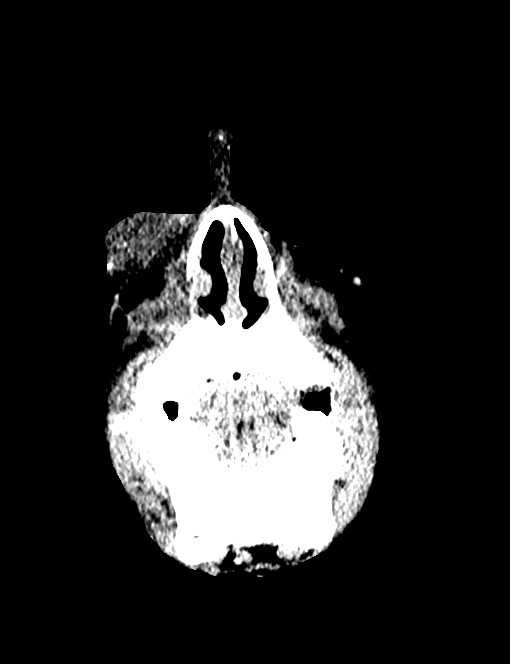
[im 19/75  bone]
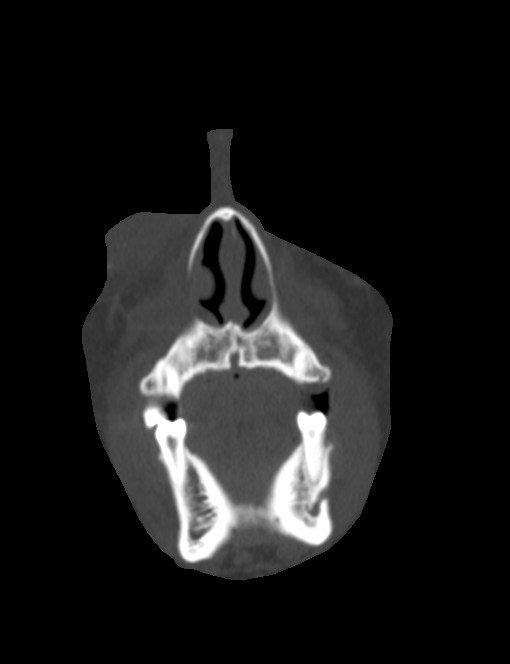
[im 38/75  bone]
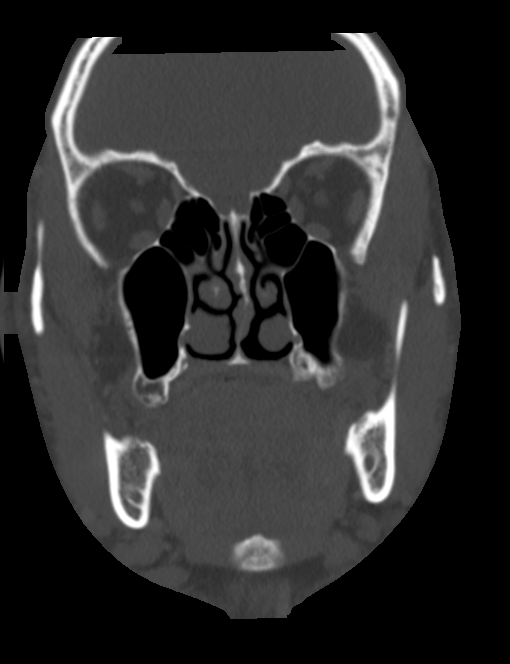
[im 56/75  bone]
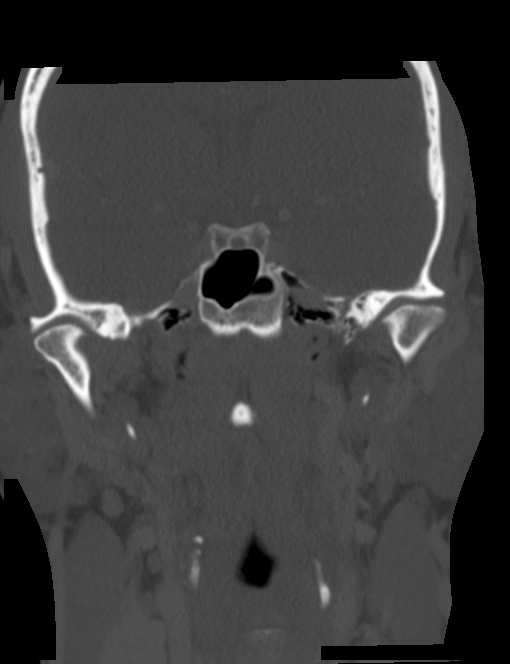

[Series 2011: sag soft tissue · sagittal · 0.30mm/px · 2 of 81 slices shown]
[im 30/81  bone]
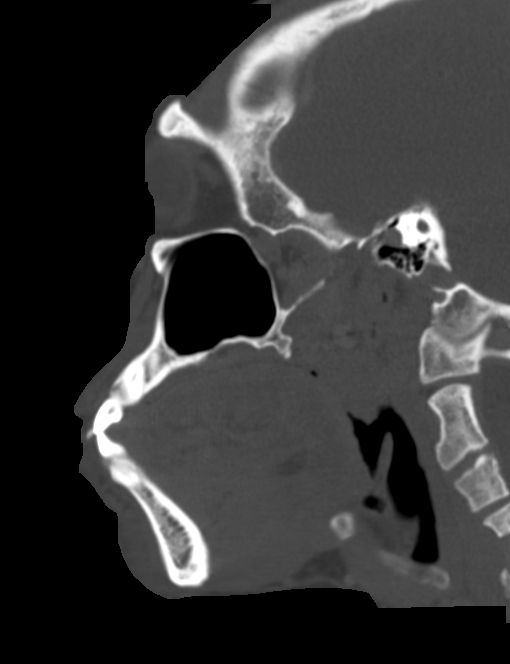
[im 52/81  bone]
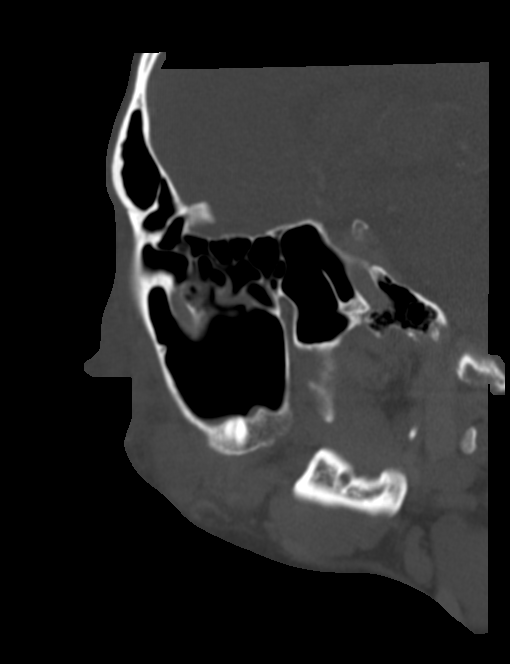

[5 of 47 positions shown; findings below may reference images not displayed]

FINDINGS: OSSEOUS: The mandible is intact, the condyles are located. No acute
facial fracture. No destructive bony lesions. Scattered dental
caries. LEFT maxillary molar periapical lucency/abscess.

ORBITS: Ocular globes and orbital contents are normal.

SINUSES: Mild paranasal sinus mucosal thickening. subcentimeter
mucosal retention cyst LEFT sphenoid sinus. Imaged mastoid air cells
are well aerated. Nasal septum is midline. Included mastoid aircells
are well aerated.

SOFT TISSUES: RIGHT medial periorbital soft tissue swelling with
ill-defined 8 x 3 x 11 mm (AP by transverse by CC) rim enhancing
fluid collection at the level of the RIGHT lacrimal duct. No
subcutaneous gas or radiopaque foreign bodies.

LIMITED INTRACRANIAL: Normal.
IMPRESSION: RIGHT periorbital cellulitis and dacryocystitis including 8 x 3 mm
inflamed lacrimal sac versus abscess. No postseptal inflammation.

Poor dentition.

## 2017-08-01 ENCOUNTER — Telehealth: Payer: Self-pay | Admitting: Pharmacy Technician

## 2017-08-01 NOTE — Telephone Encounter (Signed)
The pharmacy and I reached out multiple times to talk with Mr. Toste about refilling his Biktarvy.  Some of the calls allowed voicemail messages to be left.  None have been returned as of this date.

## 2017-08-02 ENCOUNTER — Ambulatory Visit: Payer: Managed Care, Other (non HMO) | Admitting: Infectious Disease

## 2018-01-02 ENCOUNTER — Ambulatory Visit (INDEPENDENT_AMBULATORY_CARE_PROVIDER_SITE_OTHER): Payer: Managed Care, Other (non HMO) | Admitting: Infectious Disease

## 2018-01-02 ENCOUNTER — Encounter: Payer: Self-pay | Admitting: Infectious Disease

## 2018-01-02 ENCOUNTER — Other Ambulatory Visit (HOSPITAL_COMMUNITY)
Admission: RE | Admit: 2018-01-02 | Discharge: 2018-01-02 | Disposition: A | Discharge status: Home / Self Care | Payer: Managed Care, Other (non HMO) | Source: Ambulatory Visit | Attending: Infectious Disease | Admitting: Infectious Disease

## 2018-01-02 ENCOUNTER — Other Ambulatory Visit: Payer: Self-pay | Admitting: Pharmacist

## 2018-01-02 VITALS — BP 133/88 | HR 86 | Temp 98.6°F | Wt 176.0 lb

## 2018-01-02 DIAGNOSIS — B20 Human immunodeficiency virus [HIV] disease: Secondary | ICD-10-CM | POA: Diagnosis not present

## 2018-01-02 DIAGNOSIS — A529 Late syphilis, unspecified: Secondary | ICD-10-CM | POA: Diagnosis not present

## 2018-01-02 DIAGNOSIS — Z23 Encounter for immunization: Secondary | ICD-10-CM

## 2018-01-02 MED ORDER — BICTEGRAVIR-EMTRICITAB-TENOFOV 50-200-25 MG PO TABS: 1.0000 | ORAL_TABLET | Freq: Every day | ORAL | 11 refills | Status: DC

## 2018-01-02 MED FILL — BIKTARVY 50-200-25 MG TABS: 50-200-25 | 30 days supply | Qty: 30 | Fill #0

## 2018-01-02 NOTE — Progress Notes (Signed)
Chief complaint: followup for HIV care,   Subjective:    Patient ID: Samuel Lindsey, male    DOB: January 28, 1976, 42 y.o.   MRN: [id number]  HPI  42 year old [place] American male previously superbly well controlled on [place]. I had not seen him since 2015, Then saw him again in 2016 and he was not seen again for the nearly 2 years.  He has insurance and had been receiving his antiretroviral medications but was denied refills of his Atripla when he continued to fail to make any appointments with me. When I asked him if he "stretched his medications he tells me that he was taking one half of an Atripla pill every day for 2 weeks during this time period but otherwise been taking his medications as prescribed.  I was concerned he may have failed with resistance. When I saw him in March of 2018 his VL was in 300s. We changed him to [facility] ultimately but I have not seen him for nearly a year again. He claims he has been getting his meds mailed to him via [facility] pharmacy and hopefully this is the case.  He is living in [place] and was an hour late to appt today which I told him was exception as far as seeing him this late.    Lab Results  Component Value Date   HIV1RNAQUANT 43 (H) 02/24/2015   HIV1RNAQUANT <20 03/13/2014   HIV1RNAQUANT <20 05/15/2013    Lab Results  Component Value Date   CD4TABS 860 01/06/2017   CD4TABS 780 02/24/2015   CD4TABS 710 03/13/2014   Past Medical History:  Diagnosis Date  . HIV infection (HCC)   . Sinus headache 01/06/2017  . Syphilis     No past surgical history on file.  Family History  Problem Relation Age of Onset  . Stroke Father       Social History   Socioeconomic History  . Marital status: Single    Spouse name: Not on file  . Number of children: Not on file  . Years of education: Not on file  . Highest education level: Not on file  Social Needs  . Financial resource strain: Not on file  . Food insecurity - worry: Not on file  . Food  insecurity - inability: Not on file  . Transportation needs - medical: Not on file  . Transportation needs - non-medical: Not on file  Occupational History  . Not on file  Tobacco Use  . Smoking status: Never Smoker  . Smokeless tobacco: Never Used  Substance and Sexual Activity  . Alcohol use: Yes    Alcohol/week: 0.0 oz  . Drug use: No  . Sexual activity: Yes    Partners: Male  Other Topics Concern  . Not on file  Social History Narrative  . Not on file    Allergies  Allergen Reactions  . Amoxicillin Rash     Current Outpatient Medications:  .  BIKTARVY 50-200-25 MG TABS tablet, TAKE 1 TABLET BY MOUTH DAILY., Disp: 30 tablet, Rfl: 2 .  clindamycin (CLEOCIN) 300 MG capsule, Take 1 capsule (300 mg total) by mouth 4 (four) times daily. X 7 days, Disp: 28 capsule, Rfl: 0   Review of Systems  Constitutional: Negative for activity change, appetite change, chills, diaphoresis, fatigue, fever and unexpected weight change.  HENT: Positive for congestion and sinus pressure. Negative for rhinorrhea, sneezing, sore throat and trouble swallowing.   Eyes: Positive for photophobia. Negative for visual disturbance.  Respiratory: Negative  for cough, chest tightness, shortness of breath, wheezing and stridor.   Cardiovascular: Negative for chest pain, palpitations and leg swelling.  Gastrointestinal: Negative for abdominal distention, abdominal pain, anal bleeding, blood in stool, constipation, diarrhea, nausea and vomiting.  Genitourinary: Negative for difficulty urinating, dysuria, flank pain and hematuria.  Musculoskeletal: Negative for arthralgias, back pain, gait problem, joint swelling and myalgias.  Skin: Negative for color change, pallor, rash and wound.  Neurological: Negative for dizziness, tremors, weakness and light-headedness.  Hematological: Negative for adenopathy. Does not bruise/bleed easily.  Psychiatric/Behavioral: Negative for agitation, behavioral problems, decreased  concentration, dysphoric mood and sleep disturbance.       Objective:   Physical Exam  Constitutional: He is oriented to person, place, and time. He appears well-developed and well-nourished. No distress.  HENT:  Head: Normocephalic and atraumatic.  Mouth/Throat: Oropharynx is clear and moist. No oropharyngeal exudate.  Eyes: Conjunctivae and EOM are normal. Pupils are equal, round, and reactive to light. No scleral icterus.  Neck: Normal range of motion. Neck supple. No JVD present.  Cardiovascular: Normal rate, regular rhythm and normal heart sounds. Exam reveals no gallop and no friction rub.  No murmur heard. Pulmonary/Chest: Effort normal and breath sounds normal. No respiratory distress. He has no wheezes. He has no rales. He exhibits no tenderness.  Abdominal: He exhibits no distension and no mass. There is no tenderness. There is no rebound and no guarding.  Musculoskeletal: He exhibits no edema or tenderness.  Lymphadenopathy:    He has no cervical adenopathy.  Neurological: He is alert and oriented to person, place, and time. He has normal reflexes. He exhibits normal muscle tone. Coordination normal.  Skin: Skin is warm and dry. He is not diaphoretic. No erythema. No pallor.  Psychiatric: He has a normal mood and affect. His behavior is normal. Judgment and thought content normal.  Nursing note and vitals reviewed.         Assessment & Plan:  HIV : check labs again today. RTC in one month to see me   Syphilis:  Recheck titers.

## 2018-01-03 LAB — LIPID PANEL
CHOL/HDL RATIO: 1.9 (calc) (ref <5.0)
Cholesterol: 144 mg/dL (ref <200)
HDL: 74 mg/dL (ref >40)
LDL CHOLESTEROL (CALC): 60 mg/dL
NON-HDL CHOLESTEROL (CALC): 70 mg/dL (ref <130)
Triglycerides: 34 mg/dL (ref <150)

## 2018-01-03 LAB — CBC WITH DIFFERENTIAL/PLATELET
BASOS ABS: 50 {cells}/uL (ref 0–200)
BASOS PCT: 0.9 %
EOS ABS: 39 {cells}/uL (ref 15–500)
Eosinophils Relative: 0.7 %
HCT: 43.9 % (ref 38.5–50.0)
HEMOGLOBIN: 14.9 g/dL (ref 13.2–17.1)
Lymphs Abs: 1624 cells/uL (ref 850–3900)
MCH: 30.8 pg (ref 27.0–33.0)
MCHC: 33.9 g/dL (ref 32.0–36.0)
MCV: 90.7 fL (ref 80.0–100.0)
MONOS PCT: 7.7 %
MPV: 9.4 fL (ref 7.5–12.5)
NEUTROS ABS: 3455 {cells}/uL (ref 1500–7800)
Neutrophils Relative %: 61.7 %
Platelets: 220 10*3/uL (ref 140–400)
RBC: 4.84 10*6/uL (ref 4.20–5.80)
RDW: 11.8 % (ref 11.0–15.0)
Total Lymphocyte: 29 %
WBC: 5.6 10*3/uL (ref 3.8–10.8)
WBCMIX: 431 {cells}/uL (ref 200–950)

## 2018-01-03 LAB — COMPLETE METABOLIC PANEL WITH GFR
AG RATIO: 1.4 (calc) (ref 1.0–2.5)
ALT: 133 U/L — AB (ref 9–46)
AST: 103 U/L — AB (ref 10–40)
Albumin: 4.2 g/dL (ref 3.6–5.1)
Alkaline phosphatase (APISO): 77 U/L (ref 40–115)
BUN: 10 mg/dL (ref 7–25)
CALCIUM: 9.4 mg/dL (ref 8.6–10.3)
CO2: 26 mmol/L (ref 20–32)
CREATININE: 1.12 mg/dL (ref 0.60–1.35)
Chloride: 107 mmol/L (ref 98–110)
GFR, EST NON AFRICAN AMERICAN: 81 mL/min/{1.73_m2} (ref >=60)
GFR, Est African American: 93 mL/min/{1.73_m2} (ref >=60)
GLOBULIN: 3 g/dL (ref 1.9–3.7)
Glucose, Bld: 107 mg/dL — ABNORMAL HIGH (ref 65–99)
POTASSIUM: 3.8 mmol/L (ref 3.5–5.3)
SODIUM: 143 mmol/L (ref 135–146)
TOTAL PROTEIN: 7.2 g/dL (ref 6.1–8.1)
Total Bilirubin: 0.5 mg/dL (ref 0.2–1.2)

## 2018-01-03 LAB — T-HELPER CELL (CD4) - (RCID CLINIC ONLY)
CD4 % Helper T Cell: 28 % — ABNORMAL LOW (ref 33–55)
CD4 T Cell Abs: 460 /uL (ref 400–2700)

## 2018-01-03 LAB — URINE CYTOLOGY ANCILLARY ONLY
CHLAMYDIA, DNA PROBE: NEGATIVE
Neisseria Gonorrhea: NEGATIVE

## 2018-01-03 LAB — FLUORESCENT TREPONEMAL AB(FTA)-IGG-BLD: Fluorescent Treponemal ABS: REACTIVE — AB

## 2018-01-03 LAB — RPR TITER

## 2018-01-03 LAB — RPR: RPR Ser Ql: REACTIVE — AB

## 2018-01-04 ENCOUNTER — Telehealth: Payer: Self-pay | Admitting: *Deleted

## 2018-01-04 NOTE — Telephone Encounter (Signed)
-----   Message from Randall Hissornelius N Van Dam, MD sent at 01/03/2018  3:07 PM EST ----- Patients RPR is up to 1:16. He needs either get LP and have Neurosyphilis ruled out vs get PCN IM 2.4 MU weekly x 3 weeks. [facility] [place] [facility]

## 2018-01-04 NOTE — Telephone Encounter (Signed)
[facility] and left message for him to call the office to ger results of recent labs RPR + and needs treatment.

## 2018-01-06 ENCOUNTER — Other Ambulatory Visit: Payer: Self-pay | Admitting: Pharmacist

## 2018-01-07 LAB — HIV RNA, RTPCR W/R GT (RTI, PI,INT)
HIV 1 RNA Quant: 37 copies/mL — ABNORMAL HIGH
HIV-1 RNA QUANT, LOG: 1.57 {Log_copies}/mL — AB

## 2018-01-31 ENCOUNTER — Ambulatory Visit (INDEPENDENT_AMBULATORY_CARE_PROVIDER_SITE_OTHER): Payer: Managed Care, Other (non HMO) | Admitting: Infectious Disease

## 2018-01-31 ENCOUNTER — Encounter: Payer: Self-pay | Admitting: Infectious Disease

## 2018-01-31 VITALS — BP 119/74 | HR 88 | Temp 98.6°F | Wt 178.0 lb

## 2018-01-31 DIAGNOSIS — Z79899 Other long term (current) drug therapy: Secondary | ICD-10-CM | POA: Diagnosis not present

## 2018-01-31 DIAGNOSIS — B20 Human immunodeficiency virus [HIV] disease: Secondary | ICD-10-CM

## 2018-01-31 DIAGNOSIS — R739 Hyperglycemia, unspecified: Secondary | ICD-10-CM

## 2018-01-31 DIAGNOSIS — Z113 Encounter for screening for infections with a predominantly sexual mode of transmission: Secondary | ICD-10-CM | POA: Diagnosis not present

## 2018-01-31 DIAGNOSIS — A539 Syphilis, unspecified: Secondary | ICD-10-CM

## 2018-01-31 DIAGNOSIS — Z23 Encounter for immunization: Secondary | ICD-10-CM | POA: Diagnosis not present

## 2018-01-31 HISTORY — DX: Hyperglycemia, unspecified: R73.9

## 2018-01-31 MED ORDER — BICTEGRAVIR-EMTRICITAB-TENOFOV 50-200-25 MG PO TABS: 1.0000 | ORAL_TABLET | Freq: Every day | ORAL | 11 refills | Status: AC

## 2018-01-31 MED FILL — BIKTARVY 50-200-25 MG TABS: 50-200-25 | 30 days supply | Qty: 30 | Fill #1

## 2018-01-31 NOTE — Progress Notes (Signed)
Chief complaint: followup for HIV care,   Subjective:    Patient ID: Samuel Lindsey, male    DOB: 10/04/1976, 42 y.o.   MRN: [id number]  HPI  42 year old [place] male previously superbly well controlled on [place]. I had not seen him since 2015, Then saw him again in 2016 and he was not seen again for the nearly 2 years.  He has insurance and had been receiving his antiretroviral medications but was denied refills of his Atripla when he continued to fail to make any appointments with me. When I asked him if he "stretched his medications he tells me that he was taking one half of an Atripla pill every day for 2 weeks during this time period but otherwise been taking his medications as prescribed.  I was concerned he may have failed with resistance. When I saw him in March of 2018 his VL was in 300s. We changed him to [facility] ultimately but I have not seen him for nearly a year again. He claimed he has been getting his meds mailed to him via [facility] pharmacy and hopefully this is the case.  He is living in [place] and was an hour late to appt today which I told him was exception as far as seeing him this late at last visit. We got labs and his virus was controlled. He had contracted syphilis and was treated with IM PCN.  He refused to be checked for [facility] and chlamydia in oropharynx or rectum today.  He does need HPV shot #2.   His BG was a bit elevated and was fasting.   Lab Results  Component Value Date   HIV1RNAQUANT 37 (H) 01/02/2018   HIV1RNAQUANT 43 (H) 02/24/2015   HIV1RNAQUANT <20 03/13/2014    Lab Results  Component Value Date   CD4TABS 460 01/02/2018   CD4TABS 860 01/06/2017   CD4TABS 780 02/24/2015   Past Medical History:  Diagnosis Date  . HIV infection (HCC)   . Sinus headache 01/06/2017  . Syphilis     No past surgical history on file.  Family History  Problem Relation Age of Onset  . Stroke Father       Social History   Socioeconomic History  .  Marital status: Single    Spouse name: Not on file  . Number of children: Not on file  . Years of education: Not on file  . Highest education level: Not on file  Occupational History  . Not on file  Social Needs  . Financial resource strain: Not on file  . Food insecurity:    Worry: Not on file    Inability: Not on file  . Transportation needs:    Medical: Not on file    Non-medical: Not on file  Tobacco Use  . Smoking status: Never Smoker  . Smokeless tobacco: Never Used  Substance and Sexual Activity  . Alcohol use: Yes    Alcohol/week: 0.0 oz  . Drug use: No  . Sexual activity: Yes    Partners: Male  Lifestyle  . Physical activity:    Days per week: Not on file    Minutes per session: Not on file  . Stress: Not on file  Relationships  . Social connections:    Talks on phone: Not on file    Gets together: Not on file    Attends religious service: Not on file    Active member of club or organization: Not on file    Attends meetings  of clubs or organizations: Not on file    Relationship status: Not on file  Other Topics Concern  . Not on file  Social History Narrative  . Not on file    Allergies  Allergen Reactions  . Amoxicillin Rash     Current Outpatient Medications:  .  bictegravir-emtricitabine-tenofovir AF (BIKTARVY) 50-200-25 MG TABS tablet, Take 1 tablet by mouth daily., Disp: 30 tablet, Rfl: 11 .  clindamycin (CLEOCIN) 300 MG capsule, Take 1 capsule (300 mg total) by mouth 4 (four) times daily. X 7 days (Patient not taking: Reported on 01/31/2018), Disp: 28 capsule, Rfl: 0   Review of Systems  Constitutional: Negative for activity change, appetite change, chills, diaphoresis, fatigue, fever and unexpected weight change.  HENT: Negative for congestion, rhinorrhea, sinus pressure, sneezing, sore throat and trouble swallowing.   Eyes: Negative for photophobia and visual disturbance.  Respiratory: Negative for cough, chest tightness, shortness of breath,  wheezing and stridor.   Cardiovascular: Negative for chest pain, palpitations and leg swelling.  Gastrointestinal: Negative for abdominal distention, abdominal pain, anal bleeding, blood in stool, constipation, diarrhea, nausea and vomiting.  Genitourinary: Negative for difficulty urinating, dysuria, flank pain and hematuria.  Musculoskeletal: Negative for arthralgias, back pain, gait problem, joint swelling and myalgias.  Skin: Negative for color change, pallor, rash and wound.  Neurological: Negative for dizziness, tremors, weakness and light-headedness.  Hematological: Negative for adenopathy. Does not bruise/bleed easily.  Psychiatric/Behavioral: Negative for agitation, behavioral problems, decreased concentration, dysphoric mood and sleep disturbance.       Objective:   Physical Exam  Constitutional: He is oriented to person, place, and time. He appears well-developed and well-nourished. No distress.  HENT:  Head: Normocephalic and atraumatic.  Mouth/Throat: Oropharynx is clear and moist. No oropharyngeal exudate.  Eyes: Pupils are equal, round, and reactive to light. Conjunctivae and EOM are normal. No scleral icterus.  Neck: Normal range of motion. Neck supple. No JVD present.  Cardiovascular: Normal rate, regular rhythm and normal heart sounds. Exam reveals no gallop and no friction rub.  No murmur heard. Pulmonary/Chest: Effort normal and breath sounds normal. No respiratory distress. He has no wheezes. He has no rales. He exhibits no tenderness.  Abdominal: He exhibits no distension and no mass. There is no tenderness. There is no rebound and no guarding.  Musculoskeletal: He exhibits no edema or tenderness.  Lymphadenopathy:    He has no cervical adenopathy.  Neurological: He is alert and oriented to person, place, and time. He has normal reflexes. He exhibits normal muscle tone. Coordination normal.  Skin: Skin is warm and dry. He is not diaphoretic. No erythema. No pallor.   Psychiatric: He has a normal mood and affect. His behavior is normal. Judgment and thought content normal.  Nursing note and vitals reviewed.         Assessment & Plan:  HIV : RTC in 6 months and continue BIKTARVY  Syphilis:  Recheck titers in 6 months, refused other STI tests at extragenital sites  Elevated BG: check A1c  I spent greater than 25 minutes with the patient including greater than 50% of time in face to face counsel of the patient re adherence to ARV, re viral blips and their meaning, regarding immune preservation and treatment as prevention and in coordination of his care.

## 2018-02-03 ENCOUNTER — Other Ambulatory Visit: Payer: Self-pay | Admitting: Pharmacist

## 2018-02-23 MED FILL — BIKTARVY 50-200-25 MG TABS: 50-200-25 | 30 days supply | Qty: 30 | Fill #2

## 2018-03-23 MED FILL — BIKTARVY 50-200-25 MG TABS: 50-200-25 | 30 days supply | Qty: 30 | Fill #3

## 2018-04-17 MED FILL — BIKTARVY 50-200-25 MG TABS: 50-200-25 | 30 days supply | Qty: 30 | Fill #4

## 2018-04-28 ENCOUNTER — Telehealth: Payer: Self-pay | Admitting: *Deleted

## 2018-04-28 NOTE — Telephone Encounter (Signed)
-----   Message from Nicholes CalamityMinh Q Pham, RPH-CPP sent at 04/28/2018  9:01 AM EDT ----- While talking to him today, I asked him about the treatment for syphilis. He said that he got a dose of bicillin on the visit in March but I don't see it in the comp. He was adamant about getting 2 shots at that visit.

## 2018-04-28 NOTE — Telephone Encounter (Signed)
Called the patient and never reached him so unless he was treated at the health department in [place] he was never treated. He did not get any shots at the March visit here.

## 2018-05-02 NOTE — Telephone Encounter (Signed)
[facility] to track him down for treatment. He was never treated here.

## 2018-05-02 NOTE — Telephone Encounter (Signed)
Thanks Minh! 

## 2018-05-15 MED FILL — BIKTARVY 50-200-25 MG TABS: 50-200-25 | 30 days supply | Qty: 30 | Fill #5

## 2018-06-12 MED FILL — BIKTARVY 50-200-25 MG TABS: 50-200-25 | 30 days supply | Qty: 30 | Fill #6

## 2018-07-06 MED FILL — BIKTARVY 50-200-25 MG TABS: 50-200-25 | 30 days supply | Qty: 30 | Fill #7

## 2018-07-20 ENCOUNTER — Other Ambulatory Visit: Payer: Managed Care, Other (non HMO)

## 2018-08-04 ENCOUNTER — Encounter: Payer: Managed Care, Other (non HMO) | Admitting: Infectious Disease

## 2018-09-14 ENCOUNTER — Telehealth: Payer: Self-pay | Admitting: Pharmacy Technician

## 2018-09-14 NOTE — Telephone Encounter (Signed)
[facility] and I have called multiple times with out a response.  He has not filled his HIV medicaiton with [place] since 07/06/18.
